# Patient Record
Sex: Male | Born: 1945
Health system: Southern US, Community
[De-identification: ages and names within clinical notes are randomized; demographics above are authoritative.]

## PROBLEM LIST (undated history)

## (undated) DIAGNOSIS — K635 Polyp of colon: Secondary | ICD-10-CM

## (undated) DIAGNOSIS — C4490 Unspecified malignant neoplasm of skin, unspecified: Secondary | ICD-10-CM

## (undated) DIAGNOSIS — J189 Pneumonia, unspecified organism: Secondary | ICD-10-CM

## (undated) DIAGNOSIS — I10 Essential (primary) hypertension: Secondary | ICD-10-CM

## (undated) DIAGNOSIS — T8859XA Other complications of anesthesia, initial encounter: Secondary | ICD-10-CM

## (undated) DIAGNOSIS — N400 Enlarged prostate without lower urinary tract symptoms: Secondary | ICD-10-CM

## (undated) DIAGNOSIS — K5790 Diverticulosis of intestine, part unspecified, without perforation or abscess without bleeding: Secondary | ICD-10-CM

## (undated) DIAGNOSIS — N390 Urinary tract infection, site not specified: Secondary | ICD-10-CM

## (undated) DIAGNOSIS — E785 Hyperlipidemia, unspecified: Secondary | ICD-10-CM

## (undated) HISTORY — DX: Hyperlipidemia, unspecified: E78.5

## (undated) HISTORY — PX: TONSILLECTOMY: SUR1361

## (undated) HISTORY — DX: Polyp of colon: K63.5

## (undated) HISTORY — DX: Diverticulosis of intestine, part unspecified, without perforation or abscess without bleeding: K57.90

## (undated) HISTORY — PX: APPENDECTOMY: SHX54

## (undated) HISTORY — PX: SKIN CANCER EXCISION: SHX779

## (undated) HISTORY — DX: Unspecified malignant neoplasm of skin, unspecified: C44.90

## (undated) HISTORY — DX: Pneumonia, unspecified organism: J18.9

## (undated) HISTORY — DX: Essential (primary) hypertension: I10

## (undated) HISTORY — DX: Urinary tract infection, site not specified: N39.0

## (undated) HISTORY — DX: Benign prostatic hyperplasia without lower urinary tract symptoms: N40.0

## (undated) HISTORY — PX: TRIGGER FINGER RELEASE: SHX641

---

## 2002-06-13 ENCOUNTER — Ambulatory Visit (HOSPITAL_COMMUNITY): Admission: RE | Admit: 2002-06-13 | Discharge: 2002-06-13 | Payer: Self-pay | Admitting: Gastroenterology

## 2003-08-22 DIAGNOSIS — I82409 Acute embolism and thrombosis of unspecified deep veins of unspecified lower extremity: Secondary | ICD-10-CM

## 2003-08-22 HISTORY — DX: Acute embolism and thrombosis of unspecified deep veins of unspecified lower extremity: I82.409

## 2003-09-05 ENCOUNTER — Ambulatory Visit (HOSPITAL_COMMUNITY): Admission: RE | Admit: 2003-09-05 | Discharge: 2003-09-05 | Payer: Self-pay | Admitting: Family Medicine

## 2011-11-07 DIAGNOSIS — H2510 Age-related nuclear cataract, unspecified eye: Secondary | ICD-10-CM | POA: Diagnosis not present

## 2011-11-07 DIAGNOSIS — H5210 Myopia, unspecified eye: Secondary | ICD-10-CM | POA: Diagnosis not present

## 2012-02-28 DIAGNOSIS — Z09 Encounter for follow-up examination after completed treatment for conditions other than malignant neoplasm: Secondary | ICD-10-CM | POA: Diagnosis not present

## 2012-02-28 DIAGNOSIS — K5730 Diverticulosis of large intestine without perforation or abscess without bleeding: Secondary | ICD-10-CM | POA: Diagnosis not present

## 2012-02-28 DIAGNOSIS — Z86010 Personal history of colonic polyps: Secondary | ICD-10-CM | POA: Diagnosis not present

## 2012-11-04 DIAGNOSIS — J40 Bronchitis, not specified as acute or chronic: Secondary | ICD-10-CM | POA: Diagnosis not present

## 2014-12-07 DIAGNOSIS — I8002 Phlebitis and thrombophlebitis of superficial vessels of left lower extremity: Secondary | ICD-10-CM | POA: Diagnosis not present

## 2015-04-14 DIAGNOSIS — J3089 Other allergic rhinitis: Secondary | ICD-10-CM | POA: Diagnosis not present

## 2015-04-14 DIAGNOSIS — I10 Essential (primary) hypertension: Secondary | ICD-10-CM | POA: Diagnosis not present

## 2015-04-14 DIAGNOSIS — E780 Pure hypercholesterolemia: Secondary | ICD-10-CM | POA: Diagnosis not present

## 2015-04-14 DIAGNOSIS — I8000 Phlebitis and thrombophlebitis of superficial vessels of unspecified lower extremity: Secondary | ICD-10-CM | POA: Diagnosis not present

## 2015-10-11 DIAGNOSIS — Z125 Encounter for screening for malignant neoplasm of prostate: Secondary | ICD-10-CM | POA: Diagnosis not present

## 2015-10-11 DIAGNOSIS — E7800 Pure hypercholesterolemia, unspecified: Secondary | ICD-10-CM | POA: Diagnosis not present

## 2015-10-11 DIAGNOSIS — J3089 Other allergic rhinitis: Secondary | ICD-10-CM | POA: Diagnosis not present

## 2015-10-11 DIAGNOSIS — Z0001 Encounter for general adult medical examination with abnormal findings: Secondary | ICD-10-CM | POA: Diagnosis not present

## 2015-11-12 DIAGNOSIS — R899 Unspecified abnormal finding in specimens from other organs, systems and tissues: Secondary | ICD-10-CM | POA: Diagnosis not present

## 2016-08-31 DIAGNOSIS — R21 Rash and other nonspecific skin eruption: Secondary | ICD-10-CM | POA: Diagnosis not present

## 2016-08-31 DIAGNOSIS — M170 Bilateral primary osteoarthritis of knee: Secondary | ICD-10-CM | POA: Diagnosis not present

## 2018-02-15 DIAGNOSIS — H2513 Age-related nuclear cataract, bilateral: Secondary | ICD-10-CM | POA: Diagnosis not present

## 2018-02-15 DIAGNOSIS — H5213 Myopia, bilateral: Secondary | ICD-10-CM | POA: Diagnosis not present

## 2018-10-17 DIAGNOSIS — J45909 Unspecified asthma, uncomplicated: Secondary | ICD-10-CM | POA: Diagnosis not present

## 2018-10-17 DIAGNOSIS — N451 Epididymitis: Secondary | ICD-10-CM | POA: Diagnosis not present

## 2018-10-17 DIAGNOSIS — J309 Allergic rhinitis, unspecified: Secondary | ICD-10-CM | POA: Diagnosis not present

## 2018-10-17 DIAGNOSIS — R361 Hematospermia: Secondary | ICD-10-CM | POA: Diagnosis not present

## 2019-02-18 DIAGNOSIS — H2513 Age-related nuclear cataract, bilateral: Secondary | ICD-10-CM | POA: Diagnosis not present

## 2019-02-18 DIAGNOSIS — H5213 Myopia, bilateral: Secondary | ICD-10-CM | POA: Diagnosis not present

## 2019-02-24 DIAGNOSIS — L989 Disorder of the skin and subcutaneous tissue, unspecified: Secondary | ICD-10-CM | POA: Diagnosis not present

## 2019-02-24 DIAGNOSIS — J309 Allergic rhinitis, unspecified: Secondary | ICD-10-CM | POA: Diagnosis not present

## 2019-02-24 DIAGNOSIS — M170 Bilateral primary osteoarthritis of knee: Secondary | ICD-10-CM | POA: Diagnosis not present

## 2019-02-24 DIAGNOSIS — Z0001 Encounter for general adult medical examination with abnormal findings: Secondary | ICD-10-CM | POA: Diagnosis not present

## 2019-02-24 DIAGNOSIS — I10 Essential (primary) hypertension: Secondary | ICD-10-CM | POA: Diagnosis not present

## 2019-02-24 DIAGNOSIS — D126 Benign neoplasm of colon, unspecified: Secondary | ICD-10-CM | POA: Diagnosis not present

## 2019-02-24 DIAGNOSIS — E7800 Pure hypercholesterolemia, unspecified: Secondary | ICD-10-CM | POA: Diagnosis not present

## 2019-03-03 DIAGNOSIS — D225 Melanocytic nevi of trunk: Secondary | ICD-10-CM | POA: Diagnosis not present

## 2019-03-03 DIAGNOSIS — C44519 Basal cell carcinoma of skin of other part of trunk: Secondary | ICD-10-CM | POA: Diagnosis not present

## 2019-03-03 DIAGNOSIS — Z85828 Personal history of other malignant neoplasm of skin: Secondary | ICD-10-CM | POA: Diagnosis not present

## 2019-03-03 DIAGNOSIS — D485 Neoplasm of uncertain behavior of skin: Secondary | ICD-10-CM | POA: Diagnosis not present

## 2019-03-26 DIAGNOSIS — Z85828 Personal history of other malignant neoplasm of skin: Secondary | ICD-10-CM | POA: Diagnosis not present

## 2019-03-26 DIAGNOSIS — C44519 Basal cell carcinoma of skin of other part of trunk: Secondary | ICD-10-CM | POA: Diagnosis not present

## 2019-04-14 DIAGNOSIS — Z85828 Personal history of other malignant neoplasm of skin: Secondary | ICD-10-CM | POA: Diagnosis not present

## 2019-04-14 DIAGNOSIS — L988 Other specified disorders of the skin and subcutaneous tissue: Secondary | ICD-10-CM | POA: Diagnosis not present

## 2019-04-14 DIAGNOSIS — D485 Neoplasm of uncertain behavior of skin: Secondary | ICD-10-CM | POA: Diagnosis not present

## 2019-09-19 ENCOUNTER — Other Ambulatory Visit: Payer: Self-pay | Admitting: Internal Medicine

## 2019-09-19 ENCOUNTER — Ambulatory Visit
Admission: RE | Admit: 2019-09-19 | Discharge: 2019-09-19 | Disposition: A | Discharge status: Home / Self Care | Payer: Medicare Other | Source: Ambulatory Visit | Attending: Internal Medicine | Admitting: Internal Medicine

## 2019-09-19 DIAGNOSIS — R05 Cough: Secondary | ICD-10-CM | POA: Diagnosis not present

## 2019-09-19 DIAGNOSIS — R053 Chronic cough: Secondary | ICD-10-CM

## 2019-10-30 ENCOUNTER — Ambulatory Visit: Payer: Medicare Other | Attending: Internal Medicine

## 2019-10-30 DIAGNOSIS — Z23 Encounter for immunization: Secondary | ICD-10-CM

## 2019-10-30 NOTE — Progress Notes (Signed)
   Covid-19 Vaccination Clinic  Name:  Frank Novak    MRN: [id number] DOB: 1946/06/16  10/30/2019  [name] was observed post Covid-19 immunization for 15 minutes without incident. He was provided with Vaccine Information Sheet and instruction to access the V-Safe system.   [name] was instructed to call 911 with any severe reactions post vaccine: Marland Kitchen Difficulty breathing  . Swelling of face and throat  . A fast heartbeat  . A bad rash all over body  . Dizziness and weakness   Immunizations Administered    Name Date Dose VIS Date Route   Pfizer COVID-19 Vaccine 10/30/2019 10:24 AM 0.3 mL 08/01/2019 Intramuscular   Manufacturer: [place]   Lot: [id number]   [place]: [id number]

## 2019-11-25 ENCOUNTER — Ambulatory Visit: Payer: Medicare Other | Attending: Internal Medicine

## 2019-11-25 DIAGNOSIS — Z23 Encounter for immunization: Secondary | ICD-10-CM

## 2019-11-25 NOTE — Progress Notes (Signed)
   Covid-19 Vaccination Clinic  Name:  Frank Novak    MRN: [id number] DOB: 08/19/1946  11/25/2019  [name] was observed post Covid-19 immunization for 15 minutes without incident. He was provided with Vaccine Information Sheet and instruction to access the V-Safe system.   [name] was instructed to call 911 with any severe reactions post vaccine: Marland Kitchen Difficulty breathing  . Swelling of face and throat  . A fast heartbeat  . A bad rash all over body  . Dizziness and weakness   Immunizations Administered    Name Date Dose VIS Date Route   Pfizer COVID-19 Vaccine 11/25/2019  9:01 AM 0.3 mL 08/01/2019 Intramuscular   Manufacturer: [place]   Lot: [id number]   [place]: [id number]

## 2020-03-02 DIAGNOSIS — Z85828 Personal history of other malignant neoplasm of skin: Secondary | ICD-10-CM | POA: Diagnosis not present

## 2020-03-02 DIAGNOSIS — D2271 Melanocytic nevi of right lower limb, including hip: Secondary | ICD-10-CM | POA: Diagnosis not present

## 2020-03-02 DIAGNOSIS — D225 Melanocytic nevi of trunk: Secondary | ICD-10-CM | POA: Diagnosis not present

## 2020-03-02 DIAGNOSIS — D485 Neoplasm of uncertain behavior of skin: Secondary | ICD-10-CM | POA: Diagnosis not present

## 2020-03-02 DIAGNOSIS — D2261 Melanocytic nevi of right upper limb, including shoulder: Secondary | ICD-10-CM | POA: Diagnosis not present

## 2020-03-02 DIAGNOSIS — D2262 Melanocytic nevi of left upper limb, including shoulder: Secondary | ICD-10-CM | POA: Diagnosis not present

## 2020-03-02 DIAGNOSIS — D2272 Melanocytic nevi of left lower limb, including hip: Secondary | ICD-10-CM | POA: Diagnosis not present

## 2020-03-11 DIAGNOSIS — Z79899 Other long term (current) drug therapy: Secondary | ICD-10-CM | POA: Diagnosis not present

## 2020-03-11 DIAGNOSIS — Z0000 Encounter for general adult medical examination without abnormal findings: Secondary | ICD-10-CM | POA: Diagnosis not present

## 2020-03-11 DIAGNOSIS — D126 Benign neoplasm of colon, unspecified: Secondary | ICD-10-CM | POA: Diagnosis not present

## 2020-03-11 DIAGNOSIS — I10 Essential (primary) hypertension: Secondary | ICD-10-CM | POA: Diagnosis not present

## 2020-03-11 DIAGNOSIS — Z1389 Encounter for screening for other disorder: Secondary | ICD-10-CM | POA: Diagnosis not present

## 2020-03-11 DIAGNOSIS — M170 Bilateral primary osteoarthritis of knee: Secondary | ICD-10-CM | POA: Diagnosis not present

## 2020-03-11 DIAGNOSIS — R05 Cough: Secondary | ICD-10-CM | POA: Diagnosis not present

## 2020-03-11 DIAGNOSIS — Z1211 Encounter for screening for malignant neoplasm of colon: Secondary | ICD-10-CM | POA: Diagnosis not present

## 2020-03-11 DIAGNOSIS — E7800 Pure hypercholesterolemia, unspecified: Secondary | ICD-10-CM | POA: Diagnosis not present

## 2020-03-11 DIAGNOSIS — J309 Allergic rhinitis, unspecified: Secondary | ICD-10-CM | POA: Diagnosis not present

## 2020-03-16 DIAGNOSIS — Z1211 Encounter for screening for malignant neoplasm of colon: Secondary | ICD-10-CM | POA: Diagnosis not present

## 2020-03-22 DIAGNOSIS — D485 Neoplasm of uncertain behavior of skin: Secondary | ICD-10-CM | POA: Diagnosis not present

## 2020-03-22 DIAGNOSIS — L988 Other specified disorders of the skin and subcutaneous tissue: Secondary | ICD-10-CM | POA: Diagnosis not present

## 2020-07-06 ENCOUNTER — Ambulatory Visit: Payer: Medicare Other | Attending: Internal Medicine

## 2020-07-06 ENCOUNTER — Other Ambulatory Visit: Payer: Self-pay | Admitting: Internal Medicine

## 2020-07-06 DIAGNOSIS — Z23 Encounter for immunization: Secondary | ICD-10-CM

## 2020-07-06 NOTE — Progress Notes (Signed)
   Covid-19 Vaccination Clinic  Name:  Frank Novak    MRN: [id number] DOB: 08-29-1945  07/06/2020  Mr. Soy was observed post Covid-19 immunization for 15 minutes without incident. He was provided with Vaccine Information Sheet and instruction to access the V-Safe system.   [name] was instructed to call 911 with any severe reactions post vaccine: Marland Kitchen Difficulty breathing  . Swelling of face and throat  . A fast heartbeat  . A bad rash all over body  . Dizziness and weakness   Immunizations Administered    Name Date Dose VIS Date Route   Pfizer COVID-19 Vaccine 07/06/2020 10:08 AM 0.3 mL 06/09/2020 Intramuscular   Manufacturer: [place]   Lot: [id number]   [place]: [id number]

## 2020-07-30 DIAGNOSIS — H2513 Age-related nuclear cataract, bilateral: Secondary | ICD-10-CM | POA: Diagnosis not present

## 2020-11-30 DIAGNOSIS — K625 Hemorrhage of anus and rectum: Secondary | ICD-10-CM | POA: Diagnosis not present

## 2020-11-30 DIAGNOSIS — M5430 Sciatica, unspecified side: Secondary | ICD-10-CM | POA: Diagnosis not present

## 2021-02-22 DIAGNOSIS — D2262 Melanocytic nevi of left upper limb, including shoulder: Secondary | ICD-10-CM | POA: Diagnosis not present

## 2021-02-22 DIAGNOSIS — L738 Other specified follicular disorders: Secondary | ICD-10-CM | POA: Diagnosis not present

## 2021-02-22 DIAGNOSIS — Z85828 Personal history of other malignant neoplasm of skin: Secondary | ICD-10-CM | POA: Diagnosis not present

## 2021-02-22 DIAGNOSIS — D2371 Other benign neoplasm of skin of right lower limb, including hip: Secondary | ICD-10-CM | POA: Diagnosis not present

## 2021-02-22 DIAGNOSIS — D2261 Melanocytic nevi of right upper limb, including shoulder: Secondary | ICD-10-CM | POA: Diagnosis not present

## 2021-02-22 DIAGNOSIS — L821 Other seborrheic keratosis: Secondary | ICD-10-CM | POA: Diagnosis not present

## 2021-02-22 DIAGNOSIS — D225 Melanocytic nevi of trunk: Secondary | ICD-10-CM | POA: Diagnosis not present

## 2021-03-30 DIAGNOSIS — Z79899 Other long term (current) drug therapy: Secondary | ICD-10-CM | POA: Diagnosis not present

## 2021-03-30 DIAGNOSIS — E7800 Pure hypercholesterolemia, unspecified: Secondary | ICD-10-CM | POA: Diagnosis not present

## 2021-03-30 DIAGNOSIS — I499 Cardiac arrhythmia, unspecified: Secondary | ICD-10-CM | POA: Diagnosis not present

## 2021-03-30 DIAGNOSIS — I10 Essential (primary) hypertension: Secondary | ICD-10-CM | POA: Diagnosis not present

## 2021-03-30 DIAGNOSIS — Z7189 Other specified counseling: Secondary | ICD-10-CM | POA: Diagnosis not present

## 2021-03-30 DIAGNOSIS — M170 Bilateral primary osteoarthritis of knee: Secondary | ICD-10-CM | POA: Diagnosis not present

## 2021-03-30 DIAGNOSIS — D126 Benign neoplasm of colon, unspecified: Secondary | ICD-10-CM | POA: Diagnosis not present

## 2021-03-30 DIAGNOSIS — J309 Allergic rhinitis, unspecified: Secondary | ICD-10-CM | POA: Diagnosis not present

## 2021-03-30 DIAGNOSIS — Z0000 Encounter for general adult medical examination without abnormal findings: Secondary | ICD-10-CM | POA: Diagnosis not present

## 2021-03-30 DIAGNOSIS — M5430 Sciatica, unspecified side: Secondary | ICD-10-CM | POA: Diagnosis not present

## 2021-03-30 DIAGNOSIS — Z1211 Encounter for screening for malignant neoplasm of colon: Secondary | ICD-10-CM | POA: Diagnosis not present

## 2021-03-30 DIAGNOSIS — Z1389 Encounter for screening for other disorder: Secondary | ICD-10-CM | POA: Diagnosis not present

## 2021-04-08 DIAGNOSIS — Z79899 Other long term (current) drug therapy: Secondary | ICD-10-CM | POA: Diagnosis not present

## 2021-04-08 DIAGNOSIS — I10 Essential (primary) hypertension: Secondary | ICD-10-CM | POA: Diagnosis not present

## 2021-04-08 DIAGNOSIS — E7800 Pure hypercholesterolemia, unspecified: Secondary | ICD-10-CM | POA: Diagnosis not present

## 2021-05-05 DIAGNOSIS — R82998 Other abnormal findings in urine: Secondary | ICD-10-CM | POA: Diagnosis not present

## 2021-05-06 DIAGNOSIS — D539 Nutritional anemia, unspecified: Secondary | ICD-10-CM | POA: Diagnosis not present

## 2021-05-11 DIAGNOSIS — R3129 Other microscopic hematuria: Secondary | ICD-10-CM | POA: Diagnosis not present

## 2021-05-11 DIAGNOSIS — D649 Anemia, unspecified: Secondary | ICD-10-CM | POA: Diagnosis not present

## 2021-05-11 DIAGNOSIS — I809 Phlebitis and thrombophlebitis of unspecified site: Secondary | ICD-10-CM | POA: Diagnosis not present

## 2021-05-11 DIAGNOSIS — R319 Hematuria, unspecified: Secondary | ICD-10-CM | POA: Diagnosis not present

## 2021-05-12 DIAGNOSIS — I83893 Varicose veins of bilateral lower extremities with other complications: Secondary | ICD-10-CM | POA: Diagnosis not present

## 2021-05-12 DIAGNOSIS — I10 Essential (primary) hypertension: Secondary | ICD-10-CM | POA: Diagnosis not present

## 2021-05-12 DIAGNOSIS — I809 Phlebitis and thrombophlebitis of unspecified site: Secondary | ICD-10-CM | POA: Diagnosis not present

## 2021-05-13 ENCOUNTER — Ambulatory Visit: Payer: Medicare Other

## 2021-05-13 DIAGNOSIS — I809 Phlebitis and thrombophlebitis of unspecified site: Secondary | ICD-10-CM | POA: Diagnosis not present

## 2021-05-13 DIAGNOSIS — I8002 Phlebitis and thrombophlebitis of superficial vessels of left lower extremity: Secondary | ICD-10-CM | POA: Diagnosis not present

## 2021-05-13 DIAGNOSIS — M79605 Pain in left leg: Secondary | ICD-10-CM | POA: Diagnosis not present

## 2021-05-19 DIAGNOSIS — I10 Essential (primary) hypertension: Secondary | ICD-10-CM | POA: Diagnosis not present

## 2021-05-19 DIAGNOSIS — I8000 Phlebitis and thrombophlebitis of superficial vessels of unspecified lower extremity: Secondary | ICD-10-CM | POA: Diagnosis not present

## 2021-05-23 IMAGING — CR DG CHEST 2V
2 series · 2 of 2 positions shown · non-contrast
Comparison: None.

CLINICAL DATA: Chronic cough

EXAM:
CHEST - 2 VIEW

[w chest pa]
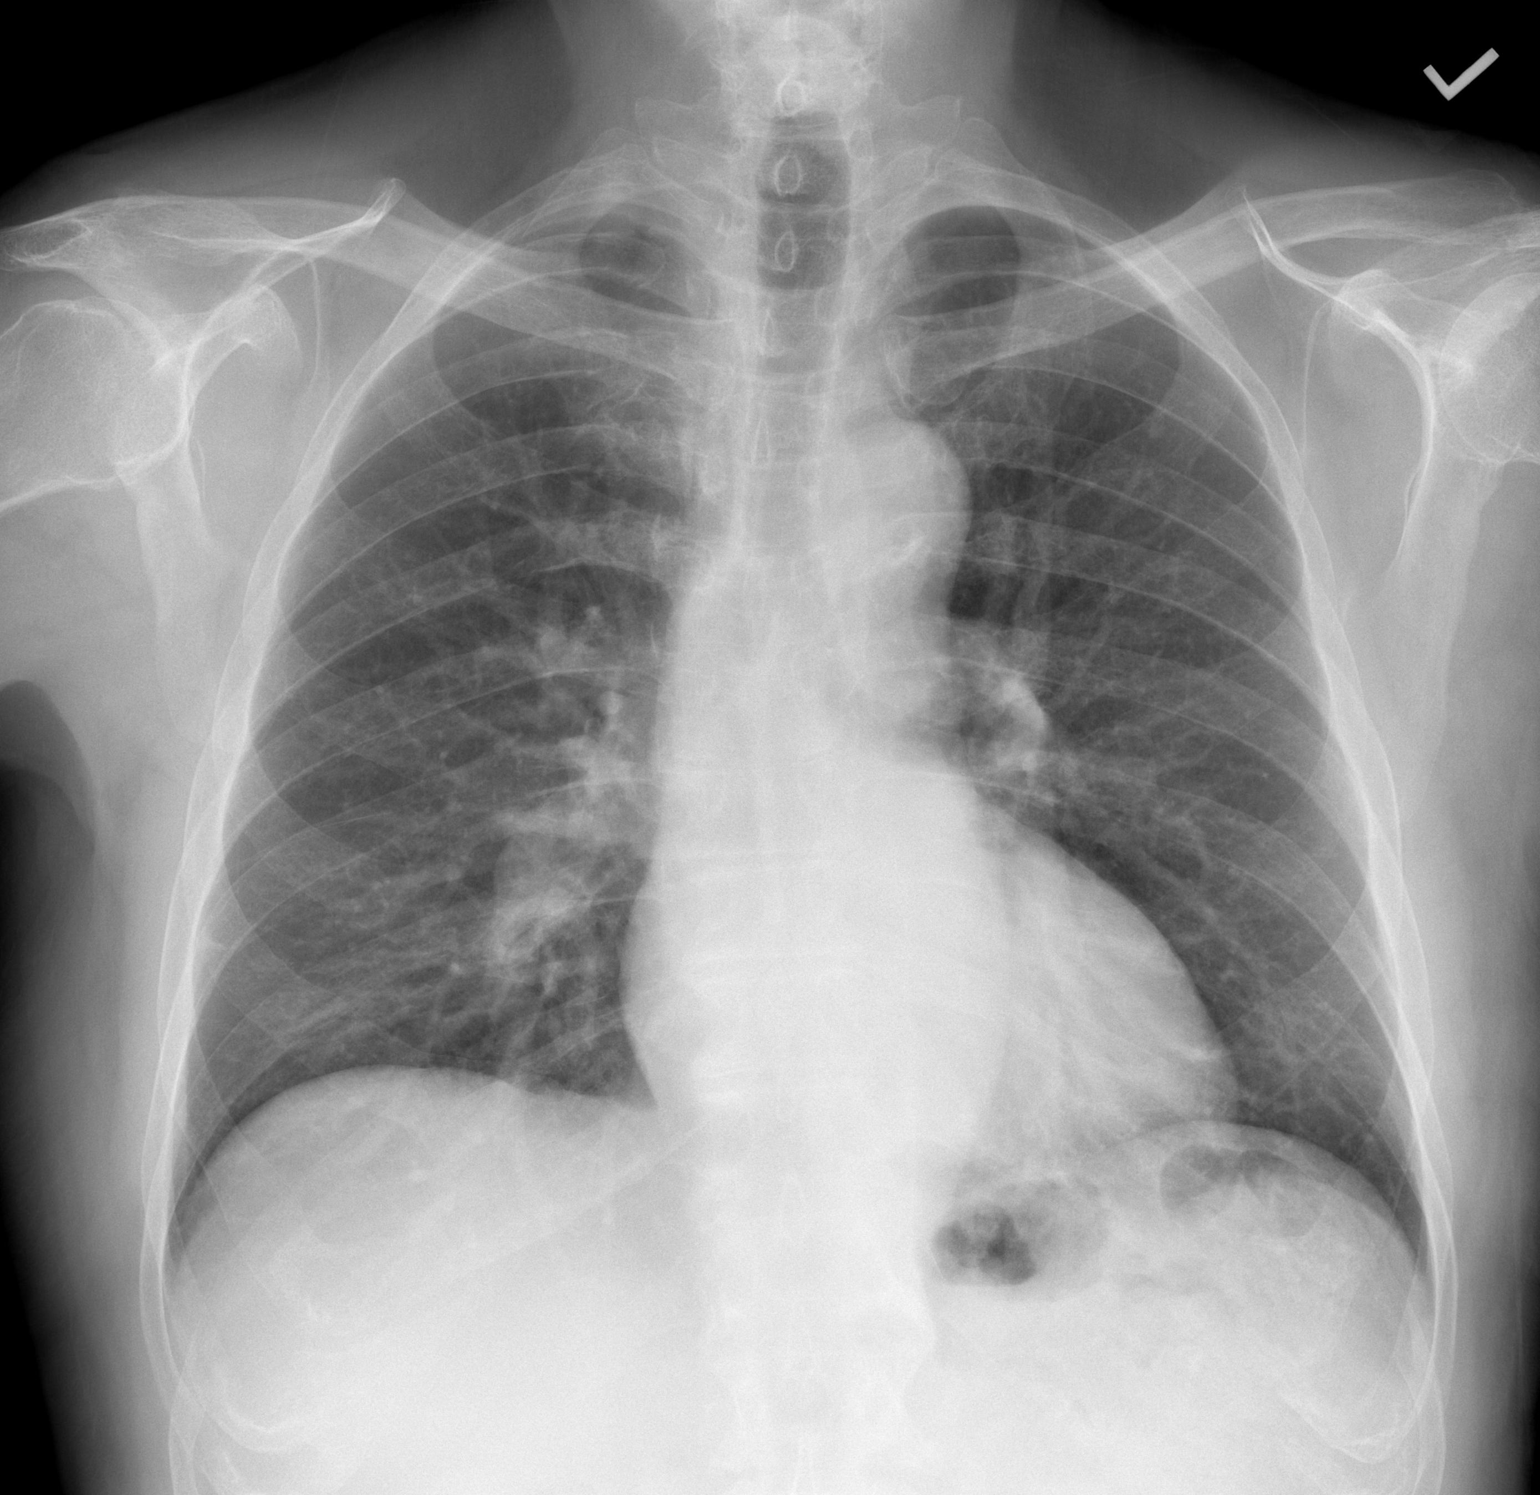

[w chest lat]
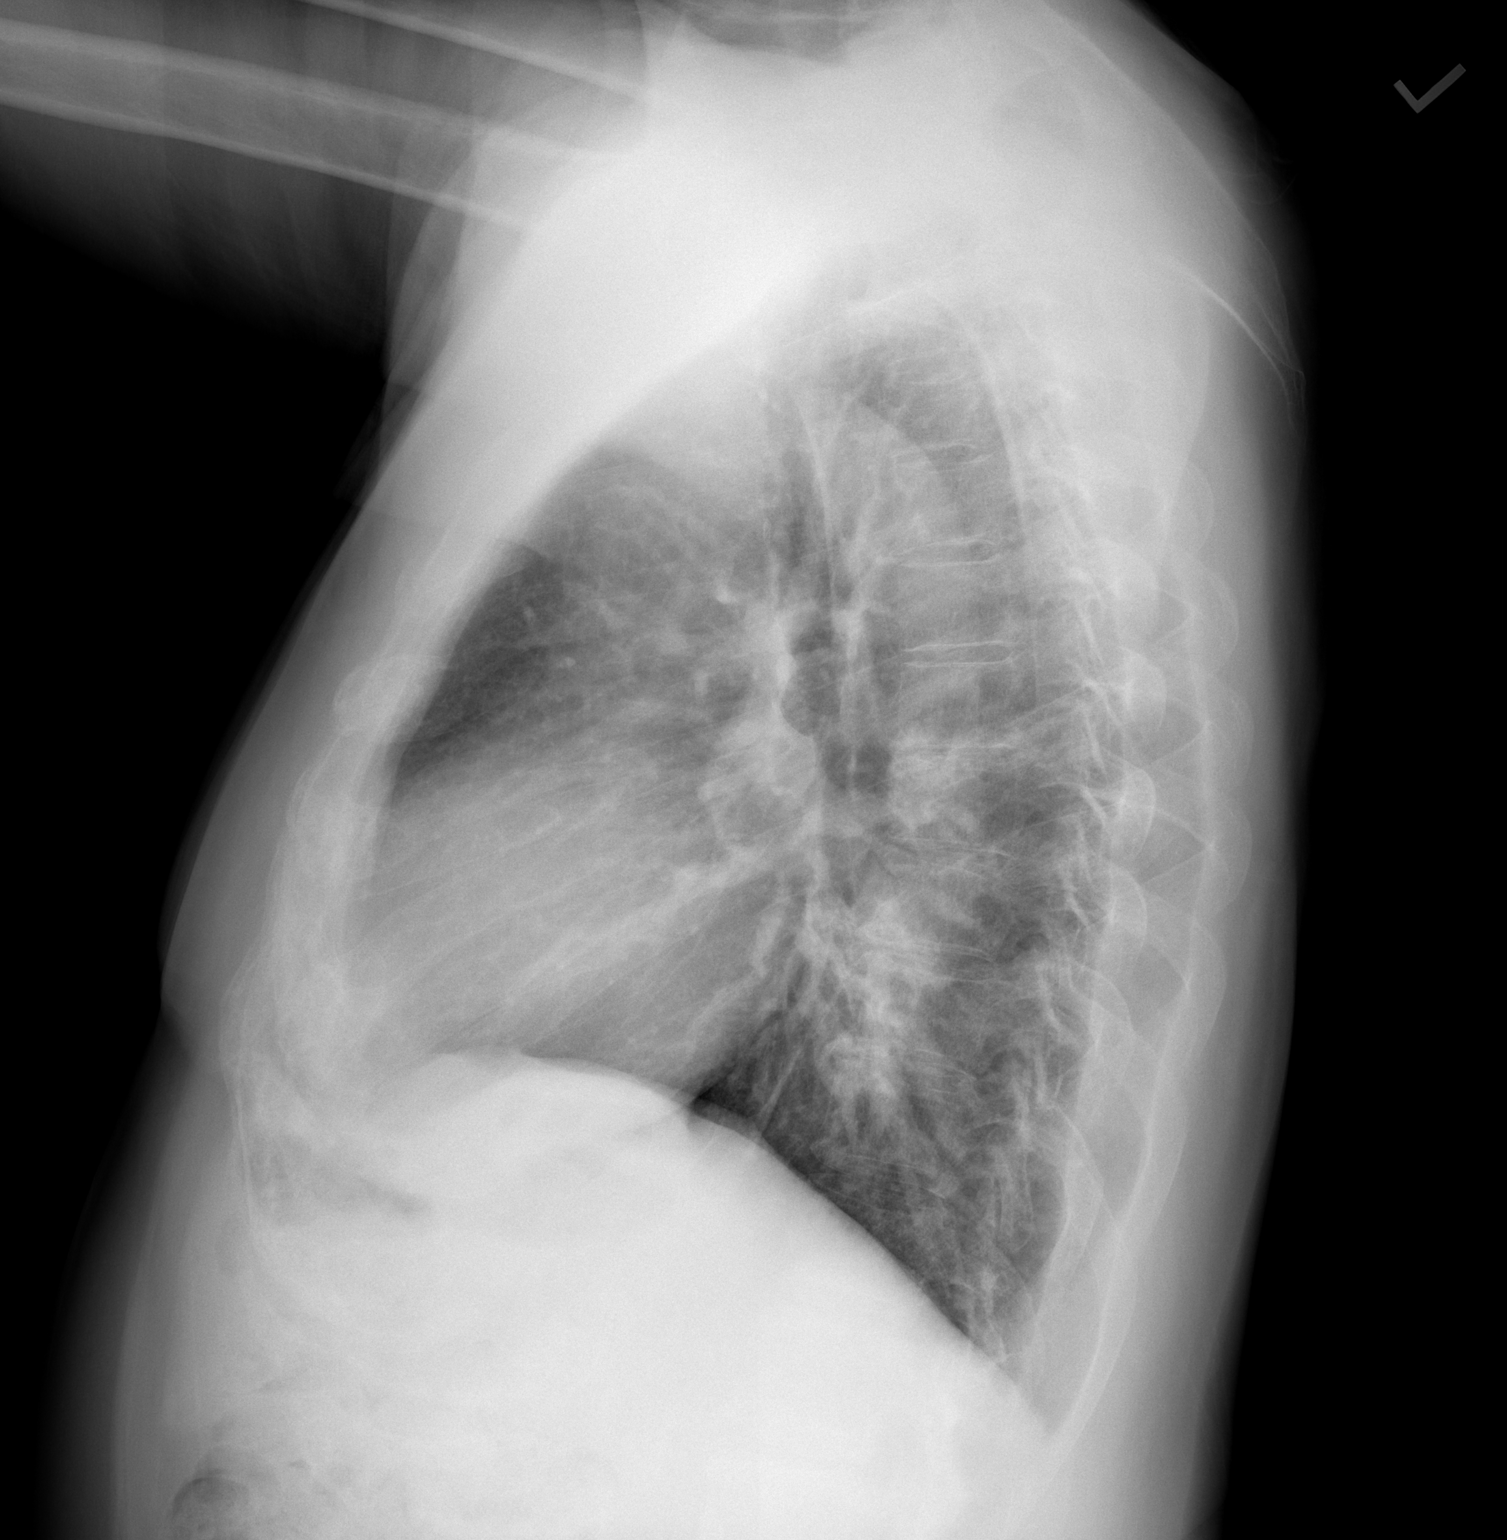

[2 of 2 positions shown; findings below may reference images not displayed]

FINDINGS: Lungs are clear. Heart size and pulmonary vascularity are normal. No
adenopathy. There is aortic atherosclerosis. No bone lesions.
IMPRESSION: Lungs clear. Cardiac silhouette normal. Aortic Atherosclerosis
([id number]).

## 2021-06-09 ENCOUNTER — Encounter: Payer: Self-pay | Admitting: *Deleted

## 2021-06-09 ENCOUNTER — Other Ambulatory Visit: Payer: Self-pay

## 2021-06-09 ENCOUNTER — Ambulatory Visit: Payer: Medicare Other | Attending: Internal Medicine

## 2021-06-09 DIAGNOSIS — Z23 Encounter for immunization: Secondary | ICD-10-CM

## 2021-06-09 MED ORDER — PFIZER COVID-19 VAC BIVALENT 30 MCG/0.3ML IM SUSP
INTRAMUSCULAR | 0 refills | Status: DC
  Filled 2021-06-09: qty 0.3, 1d supply, fill #0

## 2021-06-09 NOTE — Progress Notes (Signed)
   Covid-19 Vaccination Clinic  Name:  TALBERT TREMBATH    MRN: [id number] DOB: August 08, 1946  06/09/2021  Mr. Medlock was observed post Covid-19 immunization for 15 minutes without incident. He was provided with Vaccine Information Sheet and instruction to access the V-Safe system.   Mr. Silbernagel was instructed to call 911 with any severe reactions post vaccine: Difficulty breathing  Swelling of face and throat  A fast heartbeat  A bad rash all over body  Dizziness and weakness   Immunizations Administered     Name Date Dose VIS Date Route   Pfizer Covid-19 Vaccine Bivalent Booster 06/09/2021  1:41 PM 0.3 mL 04/20/2021 Intramuscular   Manufacturer: [place]   Lot: [place]: [id number]       Covid-19 Vaccination Clinic  Name:  KHALON CANSLER    MRN: [id number] DOB: Mar 17, 1946  06/09/2021  Mr. Ryder was observed post Covid-19 immunization for 15 minutes without incident. He was provided with Vaccine Information Sheet and instruction to access the V-Safe system.   Mr. Njoku was instructed to call 911 with any severe reactions post vaccine: Difficulty breathing  Swelling of face and throat  A fast heartbeat  A bad rash all over body  Dizziness and weakness   Immunizations Administered     Name Date Dose VIS Date Route   Pfizer Covid-19 Vaccine Bivalent Booster 06/09/2021  1:41 PM 0.3 mL 04/20/2021 Intramuscular   Manufacturer: [place]   Lot: [id number]   [place]: [phone]

## 2021-06-27 NOTE — Progress Notes (Signed)
[facility] [place]  ASSESSMENT / PLAN: Frank Novak is a 75 y.o. male with chronic venous insufficiency of bilateral lower extremity causing varicosities and thrombophlebitis (C2 disease).  Recommend compression and elevation for symptomatic relief. NSAIDs for thrombophlebitis.  No need for prophylactic anticoagulation in this gentleman.  No role for intervention unless his venous symptoms become e.g swelling becomes unbearable, or he develops venous ulceration. Follow up with me as needed.  CHIEF COMPLAINT: left calf phlebitis  HISTORY OF PRESENT ILLNESS: Frank Novak is a 75 y.o. male referred to clinic for evaluation of left calf thrombophlebitis.  The patient has a history of what sounds like a provoked DVT in the same leg treated 15 years ago with Lovenox injections.  He has been on no anticoagulation since.  He developed a posterior calf thrombophlebitis which has been moderately bothersome to him.  He saw his primary care physician for this who referred him for further evaluation and consideration of intervention for his venous disease.  The patient has no symptoms from venous disease.  He specifically denies bothersome edema, venous ulceration, dyspigmentation.  He is not bothered by his varicosities whatsoever.  He is not interested in long-term anticoagulation if he can avoid it.  He is quite active, and enjoys working around the house and in the yard.  Past Medical History:  Diagnosis Date   BPH (benign prostatic hyperplasia)    DVT (deep venous thrombosis) ([place]) 2005   LLE   Hyperlipidemia     No pertinent past surgical history  No pertinent past family history  Social History   Socioeconomic History   Marital status: Married    Spouse name: Not on file   Number of children: Not on file   Years of education: Not on file   Highest education level: Not on file  Occupational History   Not on file  Tobacco Use   Smoking status: Never   Smokeless  tobacco: Never  Vaping Use   Vaping Use: Never used  Substance and Sexual Activity   Alcohol use: Not Currently   Drug use: Never   Sexual activity: Not on file  Other Topics Concern   Not on file  Social History Narrative   Not on file   Social Determinants of Health   Financial Resource Strain: Not on file  Food Insecurity: Not on file  Transportation Needs: Not on file  Physical Activity: Not on file  Stress: Not on file  Social Connections: Not on file  Intimate Partner Violence: Not on file    Allergies  Allergen Reactions   Penicillins     Child- unsure of reaction    Tolmetin    Propofol Rash   Sulfadiazine Rash   Tetracyclines & Related Rash    No current outpatient medications on file.   No current facility-administered medications for this visit.    REVIEW OF SYSTEMS:  [X]  denotes positive finding, [ ]  denotes negative finding Cardiac  Comments:  Chest pain or chest pressure:    Shortness of breath upon exertion:    Short of breath when lying flat:    Irregular heart rhythm:        Vascular    Pain in calf, thigh, or hip brought on by ambulation:    Pain in feet at night that wakes you up from your sleep:     Blood clot in your veins:    Leg swelling:         Pulmonary  Oxygen at home:    Productive cough:     Wheezing:         Neurologic    Sudden weakness in arms or legs:     Sudden numbness in arms or legs:     Sudden onset of difficulty speaking or slurred speech:    Temporary loss of vision in one eye:     Problems with dizziness:         Gastrointestinal    Blood in stool:     Vomited blood:         Genitourinary    Burning when urinating:     Blood in urine:        Psychiatric    Major depression:         Hematologic    Bleeding problems:    Problems with blood clotting too easily:        Skin    Rashes or ulcers:        Constitutional    Fever or chills:      PHYSICAL EXAM Vitals:   06/28/21 1426  BP: (!)  157/78  Pulse: 70  Resp: 20  Temp: 98.6 F (37 C)  SpO2: 98%  Weight: 197 lb (89.4 kg)  Height: 5' 9.5" (1.765 m)    Constitutional: well appearing. no distress. Appears well nourished.  Neurologic: CN intact. no focal findings. no sensory loss. Psychiatric:  Mood and affect symmetric and appropriate. Eyes:  No icterus. No conjunctival pallor. Ears, nose, throat:  mucous membranes moist. Midline trachea.  Cardiac: regular rate and rhythm.  Respiratory:  unlabored. Abdominal:  soft, non-tender, non-distended.  Peripheral vascular: 2+ DP pulses. Prominent varicosities and reticular veins bilaterally. Extremity: no edema. no cyanosis. no pallor.  Skin: no gangrene. no ulceration.  Lymphatic: no Stemmer's sign. no palpable lymphadenopathy.  PERTINENT LABORATORY AND RADIOLOGIC DATA None  Frank Novak. Stanford Breed, MD [facility] Phone Number: [phone] 06/28/2021 5:23 PM  Total time spent on preparing this encounter including chart review, data review, collecting history, examining the patient, coordinating care for this new patient, 45 minutes.  Portions of this report may have been transcribed using voice recognition software.  Every effort has been made to ensure accuracy; however, inadvertent computerized transcription errors may still be present.

## 2021-06-28 ENCOUNTER — Other Ambulatory Visit: Payer: Self-pay

## 2021-06-28 ENCOUNTER — Encounter: Payer: Self-pay | Admitting: Vascular Surgery

## 2021-06-28 ENCOUNTER — Ambulatory Visit (INDEPENDENT_AMBULATORY_CARE_PROVIDER_SITE_OTHER): Payer: Medicare Other | Admitting: Vascular Surgery

## 2021-06-28 VITALS — BP 157/78 | HR 70 | Temp 98.6°F | Resp 20 | Ht 69.5 in | Wt 197.0 lb

## 2021-06-28 DIAGNOSIS — I8002 Phlebitis and thrombophlebitis of superficial vessels of left lower extremity: Secondary | ICD-10-CM | POA: Diagnosis not present

## 2021-06-28 DIAGNOSIS — I872 Venous insufficiency (chronic) (peripheral): Secondary | ICD-10-CM

## 2021-06-30 DIAGNOSIS — N3943 Post-void dribbling: Secondary | ICD-10-CM | POA: Diagnosis not present

## 2021-06-30 DIAGNOSIS — R030 Elevated blood-pressure reading, without diagnosis of hypertension: Secondary | ICD-10-CM | POA: Diagnosis not present

## 2021-06-30 DIAGNOSIS — R3915 Urgency of urination: Secondary | ICD-10-CM | POA: Diagnosis not present

## 2021-06-30 DIAGNOSIS — R319 Hematuria, unspecified: Secondary | ICD-10-CM | POA: Diagnosis not present

## 2021-06-30 DIAGNOSIS — N401 Enlarged prostate with lower urinary tract symptoms: Secondary | ICD-10-CM | POA: Diagnosis not present

## 2021-06-30 DIAGNOSIS — Z2821 Immunization not carried out because of patient refusal: Secondary | ICD-10-CM | POA: Diagnosis not present

## 2021-06-30 DIAGNOSIS — I809 Phlebitis and thrombophlebitis of unspecified site: Secondary | ICD-10-CM | POA: Diagnosis not present

## 2021-08-01 DIAGNOSIS — H2513 Age-related nuclear cataract, bilateral: Secondary | ICD-10-CM | POA: Diagnosis not present

## 2021-09-20 DIAGNOSIS — H2513 Age-related nuclear cataract, bilateral: Secondary | ICD-10-CM | POA: Diagnosis not present

## 2021-09-20 DIAGNOSIS — H5213 Myopia, bilateral: Secondary | ICD-10-CM | POA: Diagnosis not present

## 2021-11-15 ENCOUNTER — Telehealth: Payer: Self-pay | Admitting: Internal Medicine

## 2021-11-15 NOTE — Telephone Encounter (Signed)
Good morning Dr. Hilarie Fredrickson, ? ?We received records on this patient who is requesting a transfer of care to you.  He is being referred by his PCP, Dr. Inda Merlin.  Patient states he is having no GI issues, is not on blood thinners, and is not diabetic but is due for a colonoscopy.  Per note on referral sheet, patient has an allergy to propofol.  He refuses to go back to his prior GI doc as they had "a disagreement" regarding the propofol.  His records will be sent to you for review.  Please let me know if you approve the transfer. ? ?Thank you. ?

## 2021-11-16 NOTE — Telephone Encounter (Signed)
Records reviewed which should be scanned ? ?Last colonoscopy with Dr. Earle Gell 02/28/2012: Multiple diverticula in the left colon, exam otherwise normal ? ?Pathology results from 2003 adenomatous polyp from the descending colon ? ?Given age I would like to see him in the office first before proceeding with colonoscopy.  We will also need to discuss moderate sedation if he has a propofol allergy. ? ?Okay for office visit with me ?

## 2021-11-18 ENCOUNTER — Encounter: Payer: Self-pay | Admitting: Internal Medicine

## 2021-11-30 ENCOUNTER — Other Ambulatory Visit: Payer: Self-pay

## 2022-01-02 ENCOUNTER — Encounter: Payer: Self-pay | Admitting: *Deleted

## 2022-01-03 ENCOUNTER — Encounter: Payer: Self-pay | Admitting: Internal Medicine

## 2022-01-03 ENCOUNTER — Ambulatory Visit (INDEPENDENT_AMBULATORY_CARE_PROVIDER_SITE_OTHER): Payer: Medicare Other | Admitting: Internal Medicine

## 2022-01-03 VITALS — BP 140/70 | HR 45 | Ht 69.5 in | Wt 197.0 lb

## 2022-01-03 DIAGNOSIS — Z86010 Personal history of colonic polyps: Secondary | ICD-10-CM | POA: Diagnosis not present

## 2022-01-03 MED ORDER — PLENVU 140 G PO SOLR: 1.0000 | ORAL | 0 refills | Status: DC

## 2022-01-03 NOTE — Patient Instructions (Addendum)
You have been scheduled for a colonoscopy. Please follow written instructions given to you at your visit today.  ?Please pick up your prep supplies at the pharmacy within the next 1-3 days. ?If you use inhalers (even only as needed), please bring them with you on the day of your procedure. ? ?If you are age 78 or older, your body mass index should be between 23-30. Your Body mass index is 28.67 kg/m?Marland Kitchen If this is out of the aforementioned range listed, please consider follow up with your Primary Care Provider. ? ?If you are age 47 or younger, your body mass index should be between 19-25. Your Body mass index is 28.67 kg/m?Marland Kitchen If this is out of the aformentioned range listed, please consider follow up with your Primary Care Provider.  ? ?________________________________________________________ ? ?The [facility] providers would like to encourage you to use [facility] to communicate with providers for non-urgent requests or questions.  Due to long hold times on the telephone, sending your provider a message by [facility] may be a faster and more efficient way to get a response.  Please allow 48 business hours for a response.  Please remember that this is for non-urgent requests.  ?_____________________________________________________ ?Due to recent changes in healthcare laws, you may see the results of your imaging and laboratory studies on MyChart before your provider has had a chance to review them.  We understand that in some cases there may be results that are confusing or concerning to you. Not all laboratory results come back in the same time frame and the provider may be waiting for multiple results in order to interpret others.  Please give [place] 48 hours in order for your provider to thoroughly review all the results before contacting the office for clarification of your results.  ? ?

## 2022-01-03 NOTE — Progress Notes (Signed)
Patient ID: Frank Novak, male   DOB: [birth year], 76 y.o.   MRN: [id number] ?HPI: ?Frank Novak is a 76 year old male with a past medical history of adenomatous colon polyps, colonic diverticulosis, prior DVT not requiring chronic anticoagulation, hypertension, hyperlipidemia who is seen to discuss his history of adenomatous colon polyps and possible colonoscopy.  He is here alone today.  He is followed by Dr. Inda Merlin with [facility]. ? ?He reports that he is feeling well.  He has no specific GI complaint.  He reports that about 6 months ago he had a day of red blood per rectum.  He saw Dr. Inda Merlin who did what sounds like anoscopy and he was diagnosed with a fissure.  He has not had recurrent bleeding or perianal/rectal pain.  He reports regular bowel movements.  No constipation or diarrhea.  No melena.  No abdominal pain.  No upper GI or hepatobiliary complaint.  No dysphagia or odynophagia. ? ?No family history of GI tract malignancy. ? ?He has had 3 prior colonoscopies.  The last one that I have record was 02/28/2012 by Dr. Wynetta Emery.  This was normal with a fair prep.  He did have an adenomatous polyp removed in 2003.  He feels that he may have had a colonoscopy about 5 years ago. ? ?He recalls a definitive reaction to propofol where he developed a truncal rash within hours of sedation.  He reports he met with [facility] and requested moderate sedation but they felt like he should use Benadryl prior to propofol.  He was not happy with this recommendation. ? ?Past Medical History:  ?Diagnosis Date  ? BPH (benign prostatic hyperplasia)   ? Colon polyp   ? Diverticulosis   ? DVT (deep venous thrombosis) ([place]) 2005  ? LLE  ? Hyperlipidemia   ? Hypertension   ? Pneumonia   ? Skin cancer   ? Urinary tract infection   ? ? ?History reviewed. No pertinent surgical history. ? ?Outpatient Medications Prior to Visit  ?Medication Sig Dispense Refill  ? tamsulosin (FLOMAX) 0.4 MG CAPS capsule Take 1 capsule by mouth as needed.     ? ?No facility-administered medications prior to visit.  ? ? ?Allergies  ?Allergen Reactions  ? Penicillins   ?  Child- unsure of reaction   ? Tolmetin   ? Propofol Rash  ? Sulfadiazine Rash  ? Tetracyclines & Related Rash  ? ? ?Family History  ?Problem Relation Age of Onset  ? Breast cancer Mother   ? Stomach cancer Neg Hx   ? Colon cancer Neg Hx   ? Esophageal cancer Neg Hx   ? ? ?Social History  ? ?Tobacco Use  ? Smoking status: Never  ? Smokeless tobacco: Never  ?Vaping Use  ? Vaping Use: Never used  ?Substance Use Topics  ? Alcohol use: Not Currently  ? Drug use: Never  ? ? ?ROS: ?As per history of present illness, otherwise negative ? ?BP 140/70   Pulse (!) 45   Ht 5' 9.5" (1.765 m)   Wt 197 lb (89.4 kg)   BMI 28.67 kg/m?  ?Gen: awake, alert, NAD ?HEENT: anicteric  ?CV: RRR, , soft diastolic murmur ?Pulm: CTA b/l ?Abd: soft, NT/ND, +BS throughout ?Ext: no c/c/e ?Neuro: nonfocal ? ? ?ASSESSMENT/PLAN: ?76 year old male with a past medical history of adenomatous colon polyps, colonic diverticulosis, prior DVT not requiring chronic anticoagulation, hypertension, hyperlipidemia who is seen to discuss his history of adenomatous colon polyps and possible colonoscopy. ? ?History of  colon polyps --we discussed screening and surveillance colonoscopy as well as how it relates to age.  Typically this test is recommended until about age 40 but taking into account 1's overall physical wellbeing and other medical conditions.  He is doing quite well and is having no cardiovascular or pulmonary chronic symptoms.  He has had prior colonic polyps.  After our discussion including the risk, benefits and alternatives he is agreeable and wishes to proceed with surveillance colonoscopy.  Given the fact that he had a fair prep previously I recommend 2-day bowel preparation.  We will also use fentanyl and Versed for sedation given his history of propofol allergy. ?--Moderate sedation colonoscopy in the [place] due to propofol  allergy ?--2-day bowel preparation ? ? ? ? ? ?[id number], [place], [place] ?[address] ?[address] ?[place],  [place] [address] ? ? ?

## 2022-02-16 ENCOUNTER — Other Ambulatory Visit: Payer: Medicare Other | Admitting: Internal Medicine

## 2022-02-23 DIAGNOSIS — D2371 Other benign neoplasm of skin of right lower limb, including hip: Secondary | ICD-10-CM | POA: Diagnosis not present

## 2022-02-23 DIAGNOSIS — L738 Other specified follicular disorders: Secondary | ICD-10-CM | POA: Diagnosis not present

## 2022-02-23 DIAGNOSIS — L821 Other seborrheic keratosis: Secondary | ICD-10-CM | POA: Diagnosis not present

## 2022-02-23 DIAGNOSIS — D2261 Melanocytic nevi of right upper limb, including shoulder: Secondary | ICD-10-CM | POA: Diagnosis not present

## 2022-02-23 DIAGNOSIS — Z85828 Personal history of other malignant neoplasm of skin: Secondary | ICD-10-CM | POA: Diagnosis not present

## 2022-02-23 DIAGNOSIS — D2272 Melanocytic nevi of left lower limb, including hip: Secondary | ICD-10-CM | POA: Diagnosis not present

## 2022-02-23 DIAGNOSIS — D2271 Melanocytic nevi of right lower limb, including hip: Secondary | ICD-10-CM | POA: Diagnosis not present

## 2022-02-23 DIAGNOSIS — D225 Melanocytic nevi of trunk: Secondary | ICD-10-CM | POA: Diagnosis not present

## 2022-02-23 DIAGNOSIS — D2262 Melanocytic nevi of left upper limb, including shoulder: Secondary | ICD-10-CM | POA: Diagnosis not present

## 2022-02-23 DIAGNOSIS — D1801 Hemangioma of skin and subcutaneous tissue: Secondary | ICD-10-CM | POA: Diagnosis not present

## 2022-02-23 DIAGNOSIS — D485 Neoplasm of uncertain behavior of skin: Secondary | ICD-10-CM | POA: Diagnosis not present

## 2022-02-23 DIAGNOSIS — L918 Other hypertrophic disorders of the skin: Secondary | ICD-10-CM | POA: Diagnosis not present

## 2022-03-16 ENCOUNTER — Telehealth: Payer: Self-pay | Admitting: Internal Medicine

## 2022-03-16 NOTE — Telephone Encounter (Signed)
[facility] and have given a medicare coupon. Rx is 60 dollars. Patient has been advised of this information and verbalizes understanding.

## 2022-03-16 NOTE — Telephone Encounter (Signed)
Inbound call from patient stating he has not been able to pick his prep up from the pharmacy due to the pharmacy stating there is an issue with insurance. Patient Is scheduled for upcoming procedure 03/20/22. Please give patient a call to advise.  Thank you

## 2022-03-20 ENCOUNTER — Encounter: Payer: Self-pay | Admitting: Internal Medicine

## 2022-03-20 ENCOUNTER — Ambulatory Visit (AMBULATORY_SURGERY_CENTER): Payer: Medicare Other | Admitting: Internal Medicine

## 2022-03-20 VITALS — BP 148/63 | HR 58 | Temp 98.4°F | Resp 15 | Ht 69.0 in | Wt 197.0 lb

## 2022-03-20 DIAGNOSIS — Z86010 Personal history of colonic polyps: Secondary | ICD-10-CM

## 2022-03-20 DIAGNOSIS — Z09 Encounter for follow-up examination after completed treatment for conditions other than malignant neoplasm: Secondary | ICD-10-CM

## 2022-03-20 MED ORDER — SODIUM CHLORIDE 0.9 % IV SOLN: 500.0000 mL | Freq: Once | INTRAVENOUS | Status: DC

## 2022-03-20 NOTE — Progress Notes (Signed)
GASTROENTEROLOGY PROCEDURE H&P NOTE   Primary Care Physician: Josetta Huddle, MD    Reason for Procedure:  History of colon polyps  Plan:    Colonoscopy  Patient is appropriate for endoscopic procedure(s) in the ambulatory ([place]) setting.  The nature of the procedure, as well as the risks, benefits, and alternatives were carefully and thoroughly reviewed with the patient. Ample time for discussion and questions allowed. The patient understood, was satisfied, and agreed to proceed.     HPI: Frank Novak is a 76 y.o. male who presents for surveillance colonoscopy with moderate sedation due to propofol allergy.  Medical history as below.  Tolerated the prep.  No recent chest pain or shortness of breath.  No abdominal pain today.  Past Medical History:  Diagnosis Date   BPH (benign prostatic hyperplasia)    Colon polyp    Diverticulosis    DVT (deep venous thrombosis) ([place]) 2005   LLE   Hyperlipidemia    Hypertension    Pneumonia    Skin cancer    Urinary tract infection     History reviewed. No pertinent surgical history.  Prior to Admission medications   Medication Sig Start Date End Date Taking? Authorizing Provider  tamsulosin (FLOMAX) 0.4 MG CAPS capsule Take 1 capsule by mouth as needed. 06/30/21   [provider]    Current Outpatient Medications  Medication Sig Dispense Refill   tamsulosin (FLOMAX) 0.4 MG CAPS capsule Take 1 capsule by mouth as needed.     Current Facility-Administered Medications  Medication Dose Route Frequency Provider Last Rate Last Admin   0.9 %  sodium chloride infusion  500 mL Intravenous Once Kimmberly Wisser, Lajuan Lines, MD        Allergies as of 03/20/2022 - Review Complete 03/20/2022  Allergen Reaction Noted   Penicillins  06/09/2021   Tolmetin  06/09/2021   Propofol Rash 06/09/2021   Sulfadiazine Rash 06/09/2021   Tetracyclines & related Rash 06/09/2021    Family History  Problem Relation Age of Onset   Breast cancer Mother     Stomach cancer Neg Hx    Colon cancer Neg Hx    Esophageal cancer Neg Hx     Social History   Socioeconomic History   Marital status: Married    Spouse name: Not on file   Number of children: 0   Years of education: Not on file   Highest education level: Not on file  Occupational History   Occupation: retired Customer service manager  Tobacco Use   Smoking status: Never   Smokeless tobacco: Never  Vaping Use   Vaping Use: Never used  Substance and Sexual Activity   Alcohol use: Not Currently   Drug use: Never   Sexual activity: Not on file  Other Topics Concern   Not on file  Social History Narrative   Not on file   Social Determinants of Health   Financial Resource Strain: Not on file  Food Insecurity: Not on file  Transportation Needs: Not on file  Physical Activity: Not on file  Stress: Not on file  Social Connections: Not on file  Intimate Partner Violence: Not on file    Physical Exam: Vital signs in last 24 hours: '@BP'$  (!) 155/68 (BP Location: Right Arm, Patient Position: Sitting, Cuff Size: Normal)   Pulse (!) 58   Temp 98.4 F (36.9 C) (Temporal)   Ht '5\' 9"'$  (1.753 m)   Wt 197 lb (89.4 kg)   SpO2 98%   BMI 29.09 kg/m  GEN: NAD EYE: Sclerae anicteric ENT: MMM CV: Non-tachycardic Pulm: CTA b/l GI: Soft, NT/ND NEURO:  Alert & Oriented x 3   Zenovia Jarred, MD [facility]  03/20/2022 3:06 PM

## 2022-03-20 NOTE — Patient Instructions (Signed)
Handouts on diverticulosis and hemorrhoids given to you today   YOU HAD AN ENDOSCOPIC PROCEDURE TODAY AT [facility]:   Refer to the procedure report that was given to you for any specific questions about what was found during the examination.  If the procedure report does not answer your questions, please call your gastroenterologist to clarify.  If you requested that your care partner not be given the details of your procedure findings, then the procedure report has been included in a sealed envelope for you to review at your convenience later.  YOU SHOULD EXPECT: Some feelings of bloating in the abdomen. Passage of more gas than usual.  Walking can help get rid of the air that was put into your GI tract during the procedure and reduce the bloating. If you had a lower endoscopy (such as a colonoscopy or flexible sigmoidoscopy) you may notice spotting of blood in your stool or on the toilet paper. If you underwent a bowel prep for your procedure, you may not have a normal bowel movement for a few days.  Please Note:  You might notice some irritation and congestion in your nose or some drainage.  This is from the oxygen used during your procedure.  There is no need for concern and it should clear up in a day or so.  SYMPTOMS TO REPORT IMMEDIATELY:  Following lower endoscopy (colonoscopy or flexible sigmoidoscopy):  Excessive amounts of blood in the stool  Significant tenderness or worsening of abdominal pains  Swelling of the abdomen that is new, acute  Fever of 100F or higher  For urgent or emergent issues, a gastroenterologist can be reached at any hour by calling [phone]. Do not use MyChart messaging for urgent concerns.    DIET:  We do recommend a small meal at first, but then you may proceed to your regular diet.  Drink plenty of fluids but you should avoid alcoholic beverages for 24 hours.  ACTIVITY:  You should plan to take it easy for the rest of today and you  should NOT DRIVE or use heavy machinery until tomorrow (because of the sedation medicines used during the test).    FOLLOW UP: Our staff will call the number listed on your records the next business day following your procedure.  We will call around 7:15- 8:00 am to check on you and address any questions or concerns that you may have regarding the information given to you following your procedure. If we do not reach you, we will leave a message.  If you develop any symptoms (ie: fever, flu-like symptoms, shortness of breath, cough etc.) before then, please call [phone].  If you test positive for Covid 19 in the 2 weeks post procedure, please call and report this information to [place].    SIGNATURES/CONFIDENTIALITY: You and/or your care partner have signed paperwork which will be entered into your electronic medical record.  These signatures attest to the fact that that the information above on your After Visit Summary has been reviewed and is understood.  Full responsibility of the confidentiality of this discharge information lies with you and/or your care-partner.

## 2022-03-20 NOTE — Op Note (Signed)
[place] Patient Name: Frank Novak Procedure Date: 03/20/2022 3:07 PM MRN: [id number] Endoscopist: Jerene Bears , MD Age: 76 Referring MD:  Date of Birth: [birth year] Gender: Male Account #: [id number] Procedure:                Colonoscopy Indications:              High risk colon cancer surveillance: Personal                            history of non-advanced adenoma (2003), Last                            colonoscopy 10 years ago (no polyps on last exam) Medicines:                Monitored Anesthesia Care, Fentanyl 100 micrograms                            IV, Midazolam 10 mg IV Procedure:                Pre-Anesthesia Assessment:                           - Prior to the procedure, a History and Physical                            was performed, and patient medications and                            allergies were reviewed. The patient's tolerance of                            previous anesthesia was also reviewed. The risks                            and benefits of the procedure and the sedation                            options and risks were discussed with the patient.                            All questions were answered, and informed consent                            was obtained. Prior Anticoagulants: The patient has                            taken no previous anticoagulant or antiplatelet                            agents. ASA Grade Assessment: II - A patient with                            mild systemic disease. After reviewing the risks  and benefits, the patient was deemed in                            satisfactory condition to undergo the procedure.                           After obtaining informed consent, the colonoscope                            was passed under direct vision. Throughout the                            procedure, the patient's blood pressure, pulse, and                            oxygen saturations were  monitored continuously. The                            CF [id number] #[id number] was introduced through the anus                            and advanced to the cecum, identified by palpation.                            The colonoscopy was performed without difficulty.                            The patient tolerated the procedure well. The                            quality of the bowel preparation was good. The                            ileocecal valve, appendiceal orifice, and rectum                            were photographed. Scope In: 3:20:54 PM Scope Out: 3:36:27 PM Scope Withdrawal Time: 0 hours 9 minutes 23 seconds  Total Procedure Duration: 0 hours 15 minutes 33 seconds  Findings:                 The digital rectal exam was normal.                           Multiple small and large-mouthed diverticula were                            found in the sigmoid colon, descending colon and                            ascending colon.                           Internal hemorrhoids and anal skin tags were found  during retroflexion. The hemorrhoids were small.                           The exam was otherwise without abnormality. Complications:            No immediate complications. Estimated Blood Loss:     Estimated blood loss: none. Impression:               - Severe diverticulosis in the sigmoid colon, in                            the descending colon and mild in the ascending                            colon.                           - Internal hemorrhoids and perianal skin tags.                           - The examination was otherwise normal.                           - No specimens collected. Recommendation:           - Patient has a contact number available for                            emergencies. The signs and symptoms of potential                            delayed complications were discussed with the                            patient. Return to normal  activities tomorrow.                            Written discharge instructions were provided to the                            patient.                           - Resume previous diet.                           - Continue present medications.                           - No repeat colonoscopy due to age at next                            surveillance interval and the absence of colonic                            polyps on today's exam. Jerene Bears, MD 03/20/2022 3:40:32 PM This report has been signed electronically.

## 2022-03-20 NOTE — Progress Notes (Addendum)
Vitals-DT  Pt's states no medical or surgical changes since previsit or office visit.   CRNA is aware of the patient's propofol allergy.

## 2022-03-20 NOTE — Progress Notes (Signed)
PT taken to PACU. Monitors in place. VSS. Report given to RN. 

## 2022-03-21 ENCOUNTER — Telehealth: Payer: Self-pay | Admitting: *Deleted

## 2022-03-21 NOTE — Telephone Encounter (Signed)
  Follow up Call-     03/20/2022    2:19 PM 03/20/2022    2:14 PM  Call back number  Post procedure Call Back phone  # [phone]   Permission to leave phone message  Yes     Patient questions:  Do you have a fever, pain , or abdominal swelling? No. Pain Score  0 *  Have you tolerated food without any problems? Yes.    Have you been able to return to your normal activities? Yes.    Do you have any questions about your discharge instructions: Diet   No. Medications  No. Follow up visit  No.  Do you have questions or concerns about your Care? No.  Actions: * If pain score is 4 or above: No action needed, pain <4.

## 2022-04-03 DIAGNOSIS — D485 Neoplasm of uncertain behavior of skin: Secondary | ICD-10-CM | POA: Diagnosis not present

## 2022-04-03 DIAGNOSIS — L820 Inflamed seborrheic keratosis: Secondary | ICD-10-CM | POA: Diagnosis not present

## 2022-04-05 DIAGNOSIS — Z79899 Other long term (current) drug therapy: Secondary | ICD-10-CM | POA: Diagnosis not present

## 2022-04-05 DIAGNOSIS — E7800 Pure hypercholesterolemia, unspecified: Secondary | ICD-10-CM | POA: Diagnosis not present

## 2022-04-05 DIAGNOSIS — Z1211 Encounter for screening for malignant neoplasm of colon: Secondary | ICD-10-CM | POA: Diagnosis not present

## 2022-04-05 DIAGNOSIS — K4090 Unilateral inguinal hernia, without obstruction or gangrene, not specified as recurrent: Secondary | ICD-10-CM | POA: Diagnosis not present

## 2022-04-05 DIAGNOSIS — M170 Bilateral primary osteoarthritis of knee: Secondary | ICD-10-CM | POA: Diagnosis not present

## 2022-04-05 DIAGNOSIS — I499 Cardiac arrhythmia, unspecified: Secondary | ICD-10-CM | POA: Diagnosis not present

## 2022-04-05 DIAGNOSIS — D126 Benign neoplasm of colon, unspecified: Secondary | ICD-10-CM | POA: Diagnosis not present

## 2022-04-05 DIAGNOSIS — J309 Allergic rhinitis, unspecified: Secondary | ICD-10-CM | POA: Diagnosis not present

## 2022-04-05 DIAGNOSIS — I10 Essential (primary) hypertension: Secondary | ICD-10-CM | POA: Diagnosis not present

## 2022-04-05 DIAGNOSIS — Z1331 Encounter for screening for depression: Secondary | ICD-10-CM | POA: Diagnosis not present

## 2022-04-05 DIAGNOSIS — M5430 Sciatica, unspecified side: Secondary | ICD-10-CM | POA: Diagnosis not present

## 2022-04-05 DIAGNOSIS — Z0000 Encounter for general adult medical examination without abnormal findings: Secondary | ICD-10-CM | POA: Diagnosis not present

## 2022-06-07 DIAGNOSIS — M79604 Pain in right leg: Secondary | ICD-10-CM | POA: Diagnosis not present

## 2022-06-07 DIAGNOSIS — I10 Essential (primary) hypertension: Secondary | ICD-10-CM | POA: Diagnosis not present

## 2022-08-03 DIAGNOSIS — H2513 Age-related nuclear cataract, bilateral: Secondary | ICD-10-CM | POA: Diagnosis not present

## 2022-09-07 DIAGNOSIS — R399 Unspecified symptoms and signs involving the genitourinary system: Secondary | ICD-10-CM | POA: Diagnosis not present

## 2022-09-07 DIAGNOSIS — K921 Melena: Secondary | ICD-10-CM | POA: Diagnosis not present

## 2022-09-07 DIAGNOSIS — R198 Other specified symptoms and signs involving the digestive system and abdomen: Secondary | ICD-10-CM | POA: Diagnosis not present

## 2022-09-08 DIAGNOSIS — R198 Other specified symptoms and signs involving the digestive system and abdomen: Secondary | ICD-10-CM | POA: Diagnosis not present

## 2022-09-26 DIAGNOSIS — H5213 Myopia, bilateral: Secondary | ICD-10-CM | POA: Diagnosis not present

## 2022-09-26 DIAGNOSIS — H2513 Age-related nuclear cataract, bilateral: Secondary | ICD-10-CM | POA: Diagnosis not present

## 2023-01-16 DIAGNOSIS — M25562 Pain in left knee: Secondary | ICD-10-CM | POA: Diagnosis not present

## 2023-01-16 DIAGNOSIS — M25561 Pain in right knee: Secondary | ICD-10-CM | POA: Diagnosis not present

## 2023-01-24 DIAGNOSIS — N400 Enlarged prostate without lower urinary tract symptoms: Secondary | ICD-10-CM | POA: Diagnosis not present

## 2023-01-24 DIAGNOSIS — R3121 Asymptomatic microscopic hematuria: Secondary | ICD-10-CM | POA: Diagnosis not present

## 2023-02-05 DIAGNOSIS — I10 Essential (primary) hypertension: Secondary | ICD-10-CM | POA: Diagnosis not present

## 2023-02-05 DIAGNOSIS — M170 Bilateral primary osteoarthritis of knee: Secondary | ICD-10-CM | POA: Diagnosis not present

## 2023-02-05 DIAGNOSIS — K4090 Unilateral inguinal hernia, without obstruction or gangrene, not specified as recurrent: Secondary | ICD-10-CM | POA: Diagnosis not present

## 2023-02-05 DIAGNOSIS — E7800 Pure hypercholesterolemia, unspecified: Secondary | ICD-10-CM | POA: Diagnosis not present

## 2023-02-05 DIAGNOSIS — M5430 Sciatica, unspecified side: Secondary | ICD-10-CM | POA: Diagnosis not present

## 2023-02-19 DIAGNOSIS — K5730 Diverticulosis of large intestine without perforation or abscess without bleeding: Secondary | ICD-10-CM | POA: Diagnosis not present

## 2023-02-19 DIAGNOSIS — R3121 Asymptomatic microscopic hematuria: Secondary | ICD-10-CM | POA: Diagnosis not present

## 2023-02-19 DIAGNOSIS — N400 Enlarged prostate without lower urinary tract symptoms: Secondary | ICD-10-CM | POA: Diagnosis not present

## 2023-02-19 DIAGNOSIS — N200 Calculus of kidney: Secondary | ICD-10-CM | POA: Diagnosis not present

## 2023-02-19 DIAGNOSIS — K4090 Unilateral inguinal hernia, without obstruction or gangrene, not specified as recurrent: Secondary | ICD-10-CM | POA: Diagnosis not present

## 2023-02-21 DIAGNOSIS — I10 Essential (primary) hypertension: Secondary | ICD-10-CM | POA: Diagnosis not present

## 2023-03-07 DIAGNOSIS — R3121 Asymptomatic microscopic hematuria: Secondary | ICD-10-CM | POA: Diagnosis not present

## 2023-03-07 DIAGNOSIS — N200 Calculus of kidney: Secondary | ICD-10-CM | POA: Diagnosis not present

## 2023-03-07 DIAGNOSIS — N400 Enlarged prostate without lower urinary tract symptoms: Secondary | ICD-10-CM | POA: Diagnosis not present

## 2023-03-16 DIAGNOSIS — L218 Other seborrheic dermatitis: Secondary | ICD-10-CM | POA: Diagnosis not present

## 2023-03-16 DIAGNOSIS — D2272 Melanocytic nevi of left lower limb, including hip: Secondary | ICD-10-CM | POA: Diagnosis not present

## 2023-03-16 DIAGNOSIS — L738 Other specified follicular disorders: Secondary | ICD-10-CM | POA: Diagnosis not present

## 2023-03-16 DIAGNOSIS — D2261 Melanocytic nevi of right upper limb, including shoulder: Secondary | ICD-10-CM | POA: Diagnosis not present

## 2023-03-16 DIAGNOSIS — D225 Melanocytic nevi of trunk: Secondary | ICD-10-CM | POA: Diagnosis not present

## 2023-03-16 DIAGNOSIS — L821 Other seborrheic keratosis: Secondary | ICD-10-CM | POA: Diagnosis not present

## 2023-03-16 DIAGNOSIS — D2271 Melanocytic nevi of right lower limb, including hip: Secondary | ICD-10-CM | POA: Diagnosis not present

## 2023-03-16 DIAGNOSIS — D1801 Hemangioma of skin and subcutaneous tissue: Secondary | ICD-10-CM | POA: Diagnosis not present

## 2023-03-16 DIAGNOSIS — Z85828 Personal history of other malignant neoplasm of skin: Secondary | ICD-10-CM | POA: Diagnosis not present

## 2023-03-16 DIAGNOSIS — D2262 Melanocytic nevi of left upper limb, including shoulder: Secondary | ICD-10-CM | POA: Diagnosis not present

## 2023-04-25 ENCOUNTER — Encounter: Payer: Self-pay | Admitting: Pulmonary Disease

## 2023-04-25 ENCOUNTER — Ambulatory Visit (INDEPENDENT_AMBULATORY_CARE_PROVIDER_SITE_OTHER): Payer: Medicare Other | Admitting: Pulmonary Disease

## 2023-04-25 VITALS — BP 128/70 | HR 50 | Ht 69.5 in | Wt 196.2 lb

## 2023-04-25 DIAGNOSIS — I499 Cardiac arrhythmia, unspecified: Secondary | ICD-10-CM

## 2023-04-25 DIAGNOSIS — R911 Solitary pulmonary nodule: Secondary | ICD-10-CM

## 2023-04-25 DIAGNOSIS — R011 Cardiac murmur, unspecified: Secondary | ICD-10-CM

## 2023-04-25 DIAGNOSIS — Z85820 Personal history of malignant melanoma of skin: Secondary | ICD-10-CM | POA: Diagnosis not present

## 2023-04-25 NOTE — Patient Instructions (Addendum)
Thank you for visiting Dr. Tonia Brooms at [facility]. Today we recommend the following:  Orders Placed This Encounter  Procedures   NM PET Image Initial (PI) Skull Base To Thigh (F-18 FDG)   Return in about 3 weeks (around 05/16/2023) for with APP or Dr. Tonia Brooms, after PET/CT Chest.    Please do your part to reduce the spread of COVID-19.

## 2023-04-25 NOTE — Progress Notes (Signed)
Synopsis: Referred in September 2024 for pulmonary nodule by Crista Elliot, MD  Subjective:   PATIENT ID: Frank Mylar GENDER: male DOB: [birth year], MRN: [id number]  Chief Complaint  Patient presents with   Consult    Consult of lung nodule.    This is a 77 year old male, past medical history of BPH, history of DVT, hyperlipidemia, hypertension.  Was diagnosed with a melanoma on his back and had what appears to be a Mohs surgery.  This is all historical information from the patient.  I do not have records to review.  Patient states that this was completed at [facility] dermatology and the surgery was completed as an outpatient.  Otherwise he does not have any other history of malignancy.  And he does however state that he smoked approximately 4 cigarettes in his entire life.     Past Medical History:  Diagnosis Date   BPH (benign prostatic hyperplasia)    Colon polyp    Diverticulosis    DVT (deep venous thrombosis) (HCC) 2005   LLE   Hyperlipidemia    Hypertension    Pneumonia    Skin cancer    Urinary tract infection      Family History  Problem Relation Age of Onset   Breast cancer Mother    Stomach cancer Neg Hx    Colon cancer Neg Hx    Esophageal cancer Neg Hx      No past surgical history on file.  Social History   Socioeconomic History   Marital status: Married    Spouse name: Not on file   Number of children: 0   Years of education: Not on file   Highest education level: Not on file  Occupational History   Occupation: retired Psychologist, occupational  Tobacco Use   Smoking status: Never   Smokeless tobacco: Never  Vaping Use   Vaping status: Never Used  Substance and Sexual Activity   Alcohol use: Not Currently   Drug use: Never   Sexual activity: Not on file  Other Topics Concern   Not on file  Social History Narrative   Not on file   Social Determinants of Health   Financial Resource Strain: Not on file  Food Insecurity: Not on file   Transportation Needs: Not on file  Physical Activity: Not on file  Stress: Not on file  Social Connections: Not on file  Intimate Partner Violence: Not on file     Allergies  Allergen Reactions   Penicillins     Child- unsure of reaction    Tolmetin    Propofol Rash   Sulfadiazine Rash   Tetracyclines & Related Rash     Outpatient Medications Prior to Visit  Medication Sig Dispense Refill   hydrochlorothiazide (HYDRODIURIL) 12.5 MG tablet Take 12.5 mg by mouth daily.     naproxen (NAPROSYN) 500 MG tablet Take 500 mg by mouth 2 (two) times daily with a meal.     tamsulosin (FLOMAX) 0.4 MG CAPS capsule Take 1 capsule by mouth as needed.     No facility-administered medications prior to visit.    Review of Systems  Constitutional:  Negative for chills, fever, malaise/fatigue and weight loss.  HENT:  Negative for hearing loss, sore throat and tinnitus.   Eyes:  Negative for blurred vision and double vision.  Respiratory:  Negative for cough, hemoptysis, sputum production, shortness of breath, wheezing and stridor.   Cardiovascular:  Negative for chest pain, palpitations, orthopnea, leg swelling and PND.  Gastrointestinal:  Negative for abdominal pain, constipation, diarrhea, heartburn, nausea and vomiting.  Genitourinary:  Negative for dysuria, hematuria and urgency.  Musculoskeletal:  Negative for joint pain and myalgias.  Skin:  Negative for itching and rash.  Neurological:  Negative for dizziness, tingling, weakness and headaches.  Endo/Heme/Allergies:  Negative for environmental allergies. Does not bruise/bleed easily.  Psychiatric/Behavioral:  Negative for depression. The patient is not nervous/anxious and does not have insomnia.   All other systems reviewed and are negative.    Objective:  Physical Exam Vitals reviewed.  Constitutional:      General: He is not in acute distress.    Appearance: He is well-developed.  HENT:     Head: Normocephalic and atraumatic.   Eyes:     General: No scleral icterus.    Conjunctiva/sclera: Conjunctivae normal.     Pupils: Pupils are equal, round, and reactive to light.  Neck:     Vascular: No JVD.     Trachea: No tracheal deviation.  Cardiovascular:     Rate and Rhythm: Normal rate. Rhythm irregular.     Heart sounds: Murmur heard.  Pulmonary:     Effort: Pulmonary effort is normal. No tachypnea, accessory muscle usage or respiratory distress.     Breath sounds: No stridor. No wheezing, rhonchi or rales.  Abdominal:     General: There is no distension.     Palpations: Abdomen is soft.     Tenderness: There is no abdominal tenderness.  Musculoskeletal:        General: No tenderness.     Cervical back: Neck supple.  Lymphadenopathy:     Cervical: No cervical adenopathy.  Skin:    General: Skin is warm and dry.     Capillary Refill: Capillary refill takes less than 2 seconds.     Findings: No rash.  Neurological:     Mental Status: He is alert and oriented to person, place, and time.  Psychiatric:        Behavior: Behavior normal.      Vitals:   04/25/23 1059  BP: 128/70  Pulse: (!) 50  SpO2: 97%  Weight: 196 lb 3.2 oz (89 kg)  Height: 5' 9.5" (1.765 m)   97% on RA BMI Readings from Last 3 Encounters:  04/25/23 28.56 kg/m  03/20/22 29.09 kg/m  01/03/22 28.67 kg/m   Wt Readings from Last 3 Encounters:  04/25/23 196 lb 3.2 oz (89 kg)  03/20/22 197 lb (89.4 kg)  01/03/22 197 lb (89.4 kg)     CBC No results found for: "WBC", "RBC", "HGB", "HCT", "PLT", "MCV", "MCH", "MCHC", "RDW", "LYMPHSABS", "MONOABS", "EOSABS", "BASOSABS"    Chest Imaging:  CT chest abdomen pelvis: February 19, 2023 found to have a right lower lobe 3 cm lung mass concerning for malignancy. The patient's images have been independently reviewed by me.    Pulmonary Functions Testing Results:     No data to display          FeNO:   Pathology:   Patient believes to have had a melanoma 3 years s/p amb  surgery, sounds like MOHS procedure   Echocardiogram:   Heart Catheterization:     Assessment & Plan:     ICD-10-CM   1. Lung nodule  R91.1 NM PET Image Initial (PI) Skull Base To Thigh (F-18 FDG)    2. History of melanoma  Z85.820 NM PET Image Initial (PI) Skull Base To Thigh (F-18 FDG)    3. Murmur  R01.1 ECHOCARDIOGRAM COMPLETE  EKG 12-Lead    4. Irregular heart beat  I49.9       Discussion:  This is a 77 year old gentleman found to have a right lower lobe lung mass measuring greater than 3 cm.  He is a non-smoker smoked approximately 4 cigarettes in his entire life.  Has a history of melanoma from 3 years ago that was removed by [facility] dermatology.  He describes the procedure what sounds to be a Mohs procedure.  He also has a heart murmur that is systolic in nature and irregular heartbeat.  Plan: With his history of malignancy and a lesion greater than 3 cm its highly likely that we are dealing with a cancer within the chest. I think next best step is to obtain full imaging. I have ordered a PET scan for further evaluation. This will also help [place] decide appropriateness of location for biopsy. Patient is agreeable to this plan. RTC in approximately 2 to 3 weeks after PET scan is complete. We will also get a EKG today and I have ordered an echocardiogram for the abnormalities discovered on his cardiac exam.    Current Outpatient Medications:    hydrochlorothiazide (HYDRODIURIL) 12.5 MG tablet, Take 12.5 mg by mouth daily., Disp: , Rfl:    naproxen (NAPROSYN) 500 MG tablet, Take 500 mg by mouth 2 (two) times daily with a meal., Disp: , Rfl:    tamsulosin (FLOMAX) 0.4 MG CAPS capsule, Take 1 capsule by mouth as needed., Disp: , Rfl:    Josephine Igo, DO [facility] 04/25/2023 11:42 AM

## 2023-05-04 ENCOUNTER — Ambulatory Visit: Payer: Medicare Other

## 2023-05-07 DIAGNOSIS — J309 Allergic rhinitis, unspecified: Secondary | ICD-10-CM | POA: Diagnosis not present

## 2023-05-07 DIAGNOSIS — R918 Other nonspecific abnormal finding of lung field: Secondary | ICD-10-CM | POA: Diagnosis not present

## 2023-05-07 DIAGNOSIS — M170 Bilateral primary osteoarthritis of knee: Secondary | ICD-10-CM | POA: Diagnosis not present

## 2023-05-07 DIAGNOSIS — Z0000 Encounter for general adult medical examination without abnormal findings: Secondary | ICD-10-CM | POA: Diagnosis not present

## 2023-05-07 DIAGNOSIS — M5430 Sciatica, unspecified side: Secondary | ICD-10-CM | POA: Diagnosis not present

## 2023-05-07 DIAGNOSIS — R748 Abnormal levels of other serum enzymes: Secondary | ICD-10-CM | POA: Diagnosis not present

## 2023-05-07 DIAGNOSIS — R011 Cardiac murmur, unspecified: Secondary | ICD-10-CM | POA: Diagnosis not present

## 2023-05-07 DIAGNOSIS — K4090 Unilateral inguinal hernia, without obstruction or gangrene, not specified as recurrent: Secondary | ICD-10-CM | POA: Diagnosis not present

## 2023-05-07 DIAGNOSIS — E7800 Pure hypercholesterolemia, unspecified: Secondary | ICD-10-CM | POA: Diagnosis not present

## 2023-05-07 DIAGNOSIS — Z1331 Encounter for screening for depression: Secondary | ICD-10-CM | POA: Diagnosis not present

## 2023-05-07 DIAGNOSIS — Z79899 Other long term (current) drug therapy: Secondary | ICD-10-CM | POA: Diagnosis not present

## 2023-05-07 DIAGNOSIS — I10 Essential (primary) hypertension: Secondary | ICD-10-CM | POA: Diagnosis not present

## 2023-05-11 ENCOUNTER — Ambulatory Visit
Admission: RE | Admit: 2023-05-11 | Discharge: 2023-05-11 | Disposition: A | Discharge status: Home / Self Care | Payer: Medicare Other | Source: Ambulatory Visit | Attending: Pulmonary Disease

## 2023-05-11 DIAGNOSIS — R911 Solitary pulmonary nodule: Secondary | ICD-10-CM | POA: Diagnosis not present

## 2023-05-11 DIAGNOSIS — Z85820 Personal history of malignant melanoma of skin: Secondary | ICD-10-CM | POA: Insufficient documentation

## 2023-05-11 DIAGNOSIS — C439 Malignant melanoma of skin, unspecified: Secondary | ICD-10-CM | POA: Diagnosis not present

## 2023-05-11 LAB — GLUCOSE, CAPILLARY: Glucose-Capillary: 101 mg/dL — ABNORMAL HIGH (ref 70–99)

## 2023-05-11 MED ORDER — FLUDEOXYGLUCOSE F - 18 (FDG) INJECTION
10.2000 | Freq: Once | INTRAVENOUS | Status: AC | PRN
  Administered 2023-05-11: 10.92 via INTRAVENOUS

## 2023-05-18 ENCOUNTER — Ambulatory Visit (INDEPENDENT_AMBULATORY_CARE_PROVIDER_SITE_OTHER): Payer: Medicare Other

## 2023-05-18 DIAGNOSIS — R011 Cardiac murmur, unspecified: Secondary | ICD-10-CM

## 2023-05-18 LAB — ECHOCARDIOGRAM COMPLETE
AR max vel: 1.35 cm2
AV Area VTI: 1.42 cm2
AV Area mean vel: 1.22 cm2
AV Mean grad: 8.3 mm[Hg]
AV Peak grad: 20.5 mm[Hg]
AV Vena cont: 0.25 cm
Ao pk vel: 2.26 m/s
Area-P 1/2: 4.63 cm2
S' Lateral: 3.08 cm

## 2023-05-23 NOTE — Progress Notes (Signed)
[name], he is seeing you in follow up. Please order a dedicated liver US based on the ECHO findings.   Thanks,  BLI  Josephine Igo, DO [facility] 05/23/2023 4:14 PM

## 2023-05-24 NOTE — Progress Notes (Signed)
Thanks [employer]

## 2023-05-25 ENCOUNTER — Ambulatory Visit: Payer: Medicare Other | Admitting: Pulmonary Disease

## 2023-06-08 ENCOUNTER — Ambulatory Visit (INDEPENDENT_AMBULATORY_CARE_PROVIDER_SITE_OTHER): Payer: Medicare Other | Admitting: Primary Care

## 2023-06-08 ENCOUNTER — Encounter: Payer: Self-pay | Admitting: Primary Care

## 2023-06-08 VITALS — BP 130/70 | HR 56 | Temp 97.8°F | Ht 69.5 in | Wt 189.0 lb

## 2023-06-08 DIAGNOSIS — N401 Enlarged prostate with lower urinary tract symptoms: Secondary | ICD-10-CM | POA: Insufficient documentation

## 2023-06-08 DIAGNOSIS — K7689 Other specified diseases of liver: Secondary | ICD-10-CM

## 2023-06-08 DIAGNOSIS — R059 Cough, unspecified: Secondary | ICD-10-CM | POA: Insufficient documentation

## 2023-06-08 DIAGNOSIS — I8000 Phlebitis and thrombophlebitis of superficial vessels of unspecified lower extremity: Secondary | ICD-10-CM | POA: Insufficient documentation

## 2023-06-08 DIAGNOSIS — I10 Essential (primary) hypertension: Secondary | ICD-10-CM | POA: Insufficient documentation

## 2023-06-08 DIAGNOSIS — E7800 Pure hypercholesterolemia, unspecified: Secondary | ICD-10-CM | POA: Insufficient documentation

## 2023-06-08 DIAGNOSIS — R911 Solitary pulmonary nodule: Secondary | ICD-10-CM

## 2023-06-08 DIAGNOSIS — J309 Allergic rhinitis, unspecified: Secondary | ICD-10-CM | POA: Insufficient documentation

## 2023-06-08 DIAGNOSIS — R918 Other nonspecific abnormal finding of lung field: Secondary | ICD-10-CM | POA: Diagnosis not present

## 2023-06-08 DIAGNOSIS — I499 Cardiac arrhythmia, unspecified: Secondary | ICD-10-CM | POA: Insufficient documentation

## 2023-06-08 NOTE — Progress Notes (Signed)
Discussed, he is in agreement with proceeding with bronchoscopy for further work up for lung mass, 10/28 will work. Ordered liver u/s as well.

## 2023-06-08 NOTE — Patient Instructions (Addendum)
PET scan showed 7.1 cm right lower lobe mass, non FDG avid, not suggestive of metastasis related to melanoma. This appearance is unusual, but indolent primary bronchogenic carcinoma remains possible. Consider bronchoscopy for tissue sampling. No findings suspicious for metastatic disease.  Hepatic cyst measuring 2cm, need ultrasound to evaluated   Orders: Liver ultrasound Bronchoscopy with Dr. Tonia Brooms, planning for 06/18/23  Follow-up: 4 week virtual visit with either Dr. Tonia Brooms or Waynetta Sandy NP (Friday afternoon ok)   Flexible Bronchoscopy  Flexible bronchoscopy is a procedure used to examine the passageways in the lungs. During the procedure, a thin, flexible tool with a camera (bronchoscope) is passed into the mouth or nose, down through the windpipe (trachea), and into the air tubes in the lungs (bronchi). This tool allows the health care provider to look inside the lungs and to take samples for testing, if needed. Tell a health care provider about: Any allergies you have. All medicines you are taking, including vitamins, herbs, eye drops, creams, and over-the-counter medicines. Any problems you or family members have had with anesthetic medicines. Any bleeding problems you have. Any surgeries you have had. Any medical conditions you have. Whether you are pregnant or may be pregnant. What are the risks? Your healthcare provider will talk with you about risks. These may include: Infection. Bleeding. Damage to other structures or organs. Allergic reactions to medicines. Collapsed lung (pneumothorax). Increased need for oxygen or difficulty breathing after the procedure. What happens before the procedure? When to stop eating and drinking Follow instructions from your health care provider about what you may eat and drink. These may include: 8 hours before your procedure Stop eating most foods. Do not eat meat, fried foods, or fatty foods. Eat only light foods, such as toast or  crackers. All liquids are okay except energy drinks and alcohol. 6 hours before your procedure Stop eating. Drink only clear liquids, such as water, clear fruit juice, black coffee, plain tea, and sports drinks. Do not drink energy drinks or alcohol. 2 hours before your procedure Stop drinking all liquids. You may be allowed to take medicines with small sips of water. If you do not follow your health care provider's instructions, your procedure may be delayed or canceled. Medicines Ask your health care provider about: Changing or stopping your regular medicines. These include any diabetes medicines or blood thinners you take. Taking medicines such as aspirin and ibuprofen. These medicines can thin your blood. Do not take them unless your health care provider tells you to. Taking over-the-counter medicines, vitamins, herbs, and supplements. General instructions You may be given antibiotic medicine to help lower the risk of infection. If you will be going home right after the procedure, plan to have a responsible adult: Take you home from the hospital or clinic. You will not be allowed to drive. Care for you for the time you are told. What happens during the procedure? An IV will be inserted into one of your veins. You will be given a medicine (local anesthetic) to numb your mouth, nose, throat, and voice box (larynx). You may also be given one or more of the following: A medicine to help you relax (sedative). A medicine to control coughing. A medicine to dry up any fluids or secretions in your lungs. A bronchoscope will be passed into your nose or mouth, and into your lungs. Your health care provider will examine your lungs. Samples of airway secretions may be collected for testing. If abnormal areas are seen in your airways, samples of  tissue may be removed and checked under a microscope (biopsy). If tissue samples are needed from the outer parts of the lung, a type of X-ray  (fluoroscopy) may be used to guide the bronchoscope to these areas. If bleeding occurs, you may be given medicine to stop or decrease the bleeding. The procedure may vary among health care providers and hospitals. What happens after the procedure? Your blood pressure, heart rate, breathing rate, and blood oxygen level will be monitored until you leave the hospital or clinic. You may have a chest X-ray to check for signs of pneumothorax. You willnot be allowed to eat or drink anything for 2 hours after your procedure. If a biopsy was taken, it is up to you to get the results of the test. Ask your health care provider, or the department that is doing the procedure, when your results will be ready. Contact a health care provider if: You have a fever. Get help right away if: You have shortness of breath that gets worse. You get light-headed or feel like you might faint. You have chest pain. You cough up more than a small amount of blood. These symptoms may be an emergency. Get help right away. Call 911. Do not wait to see if the symptoms will go away. Do not drive yourself to the hospital. Summary Flexible bronchoscopy is a procedure that allows your health care provider to look closely inside your lungs and to take testing samples if needed. Risks of flexible bronchoscopy include bleeding, infection, and collapsed lung (pneumothorax). Before the procedure, you will be given a medicine to numb your mouth, nose, throat, and voice box. Then, a bronchoscope will be passed into your nose or mouth, and into your lungs. After the procedure, your blood pressure, heart rate, breathing rate, and blood oxygen level will be monitored until you leave the hospital or clinic. You may have a chest X-ray to check for signs of pneumothorax. This information is not intended to replace advice given to you by your health care provider. Make sure you discuss any questions you have with your health care  provider. Document Revised: 11/15/2021 Document Reviewed: 11/15/2021 Elsevier Patient Education  2024 [employer].

## 2023-06-08 NOTE — H&P (View-Only) (Signed)
@Patient  ID: Frank Novak, male    DOB: 1945-09-30, 77 y.o.   MRN: [id number]  Chief Complaint  Patient presents with   Follow-up    Referring provider: Emilio Aspen, *  HPI: 77 year old male, smoked.  Past medical history significant for hypertension, arrhythmia, lung nodule, BPH, hypercholesterolemia.  06/08/2023 Patient presents today for follow-up. He was originally seen by urology for trace blood in urine. Imaging revealing lung nodule. He is well today without acute respiratory complaints. He does report having a cough 2.5 years ago. He was  given inhaler by his primary care and then saw naturopathic doctor in [place] and cough went away after course of treatment.  Denies  hemoptysis or weight loss. No unusual work exposure  PET scan showed 7.1 cm right lower lobe mass, non FDG avid, not suggestive of metastasis related to melanoma. This appearance is unusual, but indolent primary bronchogenic carcinoma remains possible. Consider bronchoscopy for tissue sampling. No findings suspicious for metastatic disease.  Dr. Tonia Brooms recommending bronchoscopy for tissue sampling, patient agreeing with plan  He is no on blood thinner. Reviewed risks of procedure. Needs liver ultrasound to evaluate hepatic cyst    Allergies  Allergen Reactions   Penicillins     Child- unsure of reaction    Tolmetin    Propofol Rash   Sulfadiazine Rash   Tetracyclines & Related Rash    Immunization History  Administered Date(s) Administered   PFIZER(Purple Top)SARS-COV-2 Vaccination 10/30/2019, 11/25/2019, 07/06/2020   Pfizer Covid-19 Vaccine Bivalent Booster 68yrs & up 06/09/2021    Past Medical History:  Diagnosis Date   BPH (benign prostatic hyperplasia)    Colon polyp    Diverticulosis    DVT (deep venous thrombosis) (HCC) 2005   LLE   Hyperlipidemia    Hypertension    Pneumonia    Skin cancer    Urinary tract infection     Tobacco History: Social History   Tobacco  Use  Smoking Status Never  Smokeless Tobacco Never   Counseling given: Not Answered   Outpatient Medications Prior to Visit  Medication Sig Dispense Refill   hydrochlorothiazide (HYDRODIURIL) 12.5 MG tablet Take 12.5 mg by mouth daily.     naproxen (NAPROSYN) 500 MG tablet Take 500 mg by mouth 2 (two) times daily with a meal.     tamsulosin (FLOMAX) 0.4 MG CAPS capsule Take 1 capsule by mouth as needed.     No facility-administered medications prior to visit.      Review of Systems  Review of Systems  Constitutional: Negative.  Negative for fatigue, fever and unexpected weight change.  HENT: Negative.    Respiratory:  Negative for cough and shortness of breath.   Cardiovascular: Negative.    Physical Exam  BP 130/70 (BP Location: Right Arm, Patient Position: Sitting, Cuff Size: Normal)   Pulse (!) 56   Temp 97.8 F (36.6 C) (Oral)   Ht 5' 9.5" (1.765 m)   Wt 189 lb (85.7 kg)   SpO2 97%   BMI 27.51 kg/m  Physical Exam Constitutional:      Appearance: Normal appearance.     Comments: Appears well   HENT:     Head: Normocephalic and atraumatic.  Cardiovascular:     Rate and Rhythm: Normal rate and regular rhythm.  Pulmonary:     Effort: Pulmonary effort is normal.     Breath sounds: Normal breath sounds.  Neurological:     General: No focal deficit present.  Mental Status: He is alert and oriented to person, place, and time. Mental status is at baseline.  Psychiatric:        Mood and Affect: Mood normal.        Behavior: Behavior normal.        Thought Content: Thought content normal.        Judgment: Judgment normal.      Lab Results:  CBC No results found for: "WBC", "RBC", "HGB", "HCT", "PLT", "MCV", "MCH", "MCHC", "RDW", "LYMPHSABS", "MONOABS", "EOSABS", "BASOSABS"  BMET No results found for: "NA", "K", "CL", "CO2", "GLUCOSE", "BUN", "CREATININE", "CALCIUM", "GFRNONAA", "GFRAA"  BNP No results found for: "BNP"  ProBNP No results found for:  "PROBNP"  Imaging: ECHOCARDIOGRAM COMPLETE  Result Date: 05/18/2023    ECHOCARDIOGRAM REPORT   Patient Name:   Frank Novak Date of Exam: 05/18/2023 Medical Rec #:  [id number]    Height:       69.5 in Accession #:    [id number]   Weight:       196.2 lb Date of Birth:  09-Apr-1946   BSA:          2.059 m Patient Age:    15 years     BP:           128/70 mmHg Patient Gender: M            HR:           61 bpm. Exam Location:  Outpatient Procedure: 2D Echo, 3D Echo, Cardiac Doppler and Color Doppler Indications:    R01.1 Murmur  History:        Patient has no prior history of Echocardiogram examinations.                 Signs/Symptoms:Murmur and Altered Mental Status; Risk                 Factors:Hypertension, Dyslipidemia and Non-Smoker. Patient                 denies chest pain, SOB and leg edema. He has history of melanoma                 with lung nodule. No history of chemotherapy/radiation therapy.  Sonographer:    Carlos American RVT, RDCS (AE), RDMS Referring Phys: [id number] BRADLEY L ICARD  Sonographer Comments: Global longitudinal strain was attempted. IMPRESSIONS  1. Left ventricular ejection fraction, by estimation, is 60 to 65%. Left ventricular ejection fraction by 3D volume is 62 %. The left ventricle has normal function. The left ventricle has no regional wall motion abnormalities. Left ventricular diastolic  parameters were normal.  2. Right ventricular systolic function is normal. The right ventricular size is normal. There is normal pulmonary artery systolic pressure. The estimated right ventricular systolic pressure is 12.2 mmHg.  3. Left atrial size was mildly dilated.  4. The mitral valve is normal in structure. No evidence of mitral valve regurgitation. No evidence of mitral stenosis.  5. The aortic valve is normal in structure. There is moderate calcification of the aortic valve. There is moderate thickening of the aortic valve. Aortic valve regurgitation is mild. Aortic valve  sclerosis/calcification is present, without any evidence of aortic stenosis. Aortic valve area, by VTI measures 1.42 cm. Aortic valve mean gradient measures 8.3 mmHg. Aortic valve Vmax measures 2.26 m/s. AVA is underestimated due to small LVOT measurement.  6. The inferior vena cava is dilated in size with >50% respiratory variability, suggesting right atrial pressure of  8 mmHg. 7. Cystic stucture in the liver measuring 2.12 x 2.85cm. Consider dedicated liver US. FINDINGS  Left Ventricle: Left ventricular ejection fraction, by estimation, is 60 to 65%. Left ventricular ejection fraction by 3D volume is 62 %. The left ventricle has normal function. The left ventricle has no regional wall motion abnormalities. The left ventricular internal cavity size was normal in size. There is no left ventricular hypertrophy. Left ventricular diastolic parameters were normal. Normal left ventricular filling pressure. Right Ventricle: The right ventricular size is normal. No increase in right ventricular wall thickness. Right ventricular systolic function is normal. There is normal pulmonary artery systolic pressure. The tricuspid regurgitant velocity is 1.02 m/s, and  with an assumed right atrial pressure of 8 mmHg, the estimated right ventricular systolic pressure is 12.2 mmHg. Left Atrium: Left atrial size was mildly dilated. Right Atrium: Right atrial size was normal in size. Pericardium: There is no evidence of pericardial effusion. Mitral Valve: The mitral valve is normal in structure. Mild mitral annular calcification. No evidence of mitral valve regurgitation. No evidence of mitral valve stenosis. Tricuspid Valve: The tricuspid valve is normal in structure. Tricuspid valve regurgitation is trivial. No evidence of tricuspid stenosis. Aortic Valve: The aortic valve is normal in structure. There is moderate calcification of the aortic valve. There is moderate thickening of the aortic valve. Aortic valve regurgitation is mild.  Aortic valve sclerosis/calcification is present, without any  evidence of aortic stenosis. Aortic valve mean gradient measures 8.3 mmHg. Aortic valve peak gradient measures 20.5 mmHg. Aortic valve area, by VTI measures 1.42 cm. Pulmonic Valve: The pulmonic valve was normal in structure. Pulmonic valve regurgitation is not visualized. No evidence of pulmonic stenosis. Aorta: The aortic root is normal in size and structure. Venous: The inferior vena cava is dilated in size with greater than 50% respiratory variability, suggesting right atrial pressure of 8 mmHg. IAS/Shunts: No atrial level shunt detected by color flow Doppler.  LEFT VENTRICLE PLAX 2D LVIDd:         4.94 cm         Diastology LVIDs:         3.08 cm         LV e' medial:    9.41 cm/s LV PW:         1.10 cm         LV E/e' medial:  9.3 LV IVS:        2.31 cm         LV e' lateral:   13.60 cm/s LVOT diam:     1.90 cm         LV E/e' lateral: 6.4 LV SV:         66 LV SV Index:   32 LVOT Area:     2.84 cm        3D Volume EF                                LV 3D EF:    Left                                             ventricul  ar                                             ejection                                             fraction                                             by 3D                                             volume is                                             62 %.                                 3D Volume EF:                                3D EF:        62 %                                LV EDV:       155 ml                                LV ESV:       59 ml                                LV SV:        96 ml RIGHT VENTRICLE RV S prime:     14.40 cm/s TAPSE (M-mode): 2.6 cm LEFT ATRIUM             Index        RIGHT ATRIUM           Index LA diam:        4.30 cm 2.09 cm/m   RA Area:     13.40 cm LA Vol (A2C):   81.1 ml 39.38 ml/m  RA Volume:   28.30 ml  13.74 ml/m LA Vol (A4C):   66.5 ml  32.29 ml/m LA Biplane Vol: 75.6 ml 36.71 ml/m  AORTIC VALVE                     PULMONIC VALVE AV Area (Vmax):    1.35 cm      PV Vmax:       1.14 m/s AV Area (Vmean):   1.22 cm      PV  Peak grad:  5.2 mmHg AV Area (VTI):     1.42 cm AV Vmax:           226.33 cm/s AV Vmean:          131.333 cm/s AV VTI:            0.468 m AV Peak Grad:      20.5 mmHg AV Mean Grad:      8.3 mmHg LVOT Vmax:         108.00 cm/s LVOT Vmean:        56.600 cm/s LVOT VTI:          0.234 m LVOT/AV VTI ratio: 0.50 AR Vena Contracta: 0.25 cm  AORTA Ao Root diam: 3.50 cm Ao Asc diam:  3.40 cm Ao Arch diam: 3.0 cm MITRAL VALVE               TRICUSPID VALVE MV Area (PHT): 4.63 cm    TR Peak grad:   4.2 mmHg MV Decel Time: 164 msec    TR Vmax:        102.00 cm/s MV E velocity: 87.40 cm/s MV A velocity: 99.50 cm/s  SHUNTS MV E/A ratio:  0.88        Systemic VTI:  0.23 m                            Systemic Diam: 1.90 cm Armanda Magic MD Electronically signed by Armanda Magic MD Signature Date/Time: 05/18/2023/10:49:14 AM    Final      Assessment & Plan:   Lung nodule - PET scan showed 7.1 cm right lower lobe mass, non FDG avid, not suggestive of metastasis related to melanoma. This appearance is unusual, but indolent primary bronchogenic carcinoma remains possible. Consider bronchoscopy for tissue sampling. No findings suspicious for metastatic disease. Hepatic cyst measuring 2cm.  - We discussed CT imaging results and recommendations for tissue sampling via bronchoscopy.  Patient in agreement with proceeding with procedure for further evaluation of right lower lung mass. Bronchoscopy will be set for 06/18/23 with Dr. Tonia Brooms. He is not on blood thinner. Patient education on bronchoscopy procedure given. Reviewed risks of procedure. Needs liver ultrasound to evaluate hepatic cyst   Orders: Liver ultrasound Bronchoscopy with Dr. Tonia Brooms, planning for 06/18/23  Follow-up: 4 week virtual visit with either Dr. Tonia Brooms or Waynetta Sandy NP (Friday  afternoon ok)    Glenford Bayley, NP 06/11/2023

## 2023-06-08 NOTE — Progress Notes (Unsigned)
@Patient  ID: Frank Novak, male    DOB: 1945-09-30, 77 y.o.   MRN: [id number]  Chief Complaint  Patient presents with   Follow-up    Referring provider: Emilio Novak, *  HPI: 77 year old male, smoked.  Past medical history significant for hypertension, arrhythmia, lung nodule, BPH, hypercholesterolemia.  06/08/2023 Patient presents today for follow-up. He was originally seen by urology for trace blood in urine. Imaging revealing lung nodule. He is well today without acute respiratory complaints. He does report having a cough 2.5 years ago. He was  given inhaler by his primary care and then saw naturopathic doctor in [place] and cough went away after course of treatment.  Denies  hemoptysis or weight loss. No unusual work exposure  PET scan showed 7.1 cm right lower lobe mass, non FDG avid, not suggestive of metastasis related to melanoma. This appearance is unusual, but indolent primary bronchogenic carcinoma remains possible. Consider bronchoscopy for tissue sampling. No findings suspicious for metastatic disease.  Dr. Tonia Novak recommending bronchoscopy for tissue sampling, patient agreeing with plan  He is no on blood thinner. Reviewed risks of procedure. Needs liver ultrasound to evaluate hepatic cyst    Allergies  Allergen Reactions   Penicillins     Child- unsure of reaction    Tolmetin    Propofol Rash   Sulfadiazine Rash   Tetracyclines & Related Rash    Immunization History  Administered Date(s) Administered   PFIZER(Purple Top)SARS-COV-2 Vaccination 10/30/2019, 11/25/2019, 07/06/2020   Pfizer Covid-19 Vaccine Bivalent Booster 68yrs & up 06/09/2021    Past Medical History:  Diagnosis Date   BPH (benign prostatic hyperplasia)    Colon polyp    Diverticulosis    DVT (deep venous thrombosis) (HCC) 2005   LLE   Hyperlipidemia    Hypertension    Pneumonia    Skin cancer    Urinary tract infection     Tobacco History: Social History   Tobacco  Use  Smoking Status Never  Smokeless Tobacco Never   Counseling given: Not Answered   Outpatient Medications Prior to Visit  Medication Sig Dispense Refill   hydrochlorothiazide (HYDRODIURIL) 12.5 MG tablet Take 12.5 mg by mouth daily.     naproxen (NAPROSYN) 500 MG tablet Take 500 mg by mouth 2 (two) times daily with a meal.     tamsulosin (FLOMAX) 0.4 MG CAPS capsule Take 1 capsule by mouth as needed.     No facility-administered medications prior to visit.      Review of Systems  Review of Systems  Constitutional: Negative.  Negative for fatigue, fever and unexpected weight change.  HENT: Negative.    Respiratory:  Negative for cough and shortness of breath.   Cardiovascular: Negative.    Physical Exam  BP 130/70 (BP Location: Right Arm, Patient Position: Sitting, Cuff Size: Normal)   Pulse (!) 56   Temp 97.8 F (36.6 C) (Oral)   Ht 5' 9.5" (1.765 m)   Wt 189 lb (85.7 kg)   SpO2 97%   BMI 27.51 kg/m  Physical Exam Constitutional:      Appearance: Normal appearance.     Comments: Appears well   HENT:     Head: Normocephalic and atraumatic.  Cardiovascular:     Rate and Rhythm: Normal rate and regular rhythm.  Pulmonary:     Effort: Pulmonary effort is normal.     Breath sounds: Normal breath sounds.  Neurological:     General: No focal deficit present.  Mental Status: He is alert and oriented to person, place, and time. Mental status is at baseline.  Psychiatric:        Mood and Affect: Mood normal.        Behavior: Behavior normal.        Thought Content: Thought content normal.        Judgment: Judgment normal.      Lab Results:  CBC No results found for: "WBC", "RBC", "HGB", "HCT", "PLT", "MCV", "MCH", "MCHC", "RDW", "LYMPHSABS", "MONOABS", "EOSABS", "BASOSABS"  BMET No results found for: "NA", "K", "CL", "CO2", "GLUCOSE", "BUN", "CREATININE", "CALCIUM", "GFRNONAA", "GFRAA"  BNP No results found for: "BNP"  ProBNP No results found for:  "PROBNP"  Imaging: ECHOCARDIOGRAM COMPLETE  Result Date: 05/18/2023    ECHOCARDIOGRAM REPORT   Patient Name:   Frank Novak Date of Exam: 05/18/2023 Medical Rec #:  [id number]    Height:       69.5 in Accession #:    [id number]   Weight:       196.2 lb Date of Birth:  09-Apr-1946   BSA:          2.059 m Patient Age:    15 years     BP:           128/70 mmHg Patient Gender: M            HR:           61 bpm. Exam Location:  Outpatient Procedure: 2D Echo, 3D Echo, Cardiac Doppler and Color Doppler Indications:    R01.1 Murmur  History:        Patient has no prior history of Echocardiogram examinations.                 Signs/Symptoms:Murmur and Altered Mental Status; Risk                 Factors:Hypertension, Dyslipidemia and Non-Smoker. Patient                 denies chest pain, SOB and leg edema. He has history of melanoma                 with lung nodule. No history of chemotherapy/radiation therapy.  Sonographer:    Frank Novak RVT, RDCS (AE), RDMS Referring Phys: [id number] Frank Novak  Sonographer Comments: Global longitudinal strain was attempted. IMPRESSIONS  1. Left ventricular ejection fraction, by estimation, is 60 to 65%. Left ventricular ejection fraction by 3D volume is 62 %. The left ventricle has normal function. The left ventricle has no regional wall motion abnormalities. Left ventricular diastolic  parameters were normal.  2. Right ventricular systolic function is normal. The right ventricular size is normal. There is normal pulmonary artery systolic pressure. The estimated right ventricular systolic pressure is 12.2 mmHg.  3. Left atrial size was mildly dilated.  4. The mitral valve is normal in structure. No evidence of mitral valve regurgitation. No evidence of mitral stenosis.  5. The aortic valve is normal in structure. There is moderate calcification of the aortic valve. There is moderate thickening of the aortic valve. Aortic valve regurgitation is mild. Aortic valve  sclerosis/calcification is present, without any evidence of aortic stenosis. Aortic valve area, by VTI measures 1.42 cm. Aortic valve mean gradient measures 8.3 mmHg. Aortic valve Vmax measures 2.26 m/s. AVA is underestimated due to small LVOT measurement.  6. The inferior vena cava is dilated in size with >50% respiratory variability, suggesting right atrial pressure of  8 mmHg. 7. Cystic stucture in the liver measuring 2.12 x 2.85cm. Consider dedicated liver US. FINDINGS  Left Ventricle: Left ventricular ejection fraction, by estimation, is 60 to 65%. Left ventricular ejection fraction by 3D volume is 62 %. The left ventricle has normal function. The left ventricle has no regional wall motion abnormalities. The left ventricular internal cavity size was normal in size. There is no left ventricular hypertrophy. Left ventricular diastolic parameters were normal. Normal left ventricular filling pressure. Right Ventricle: The right ventricular size is normal. No increase in right ventricular wall thickness. Right ventricular systolic function is normal. There is normal pulmonary artery systolic pressure. The tricuspid regurgitant velocity is 1.02 m/s, and  with an assumed right atrial pressure of 8 mmHg, the estimated right ventricular systolic pressure is 12.2 mmHg. Left Atrium: Left atrial size was mildly dilated. Right Atrium: Right atrial size was normal in size. Pericardium: There is no evidence of pericardial effusion. Mitral Valve: The mitral valve is normal in structure. Mild mitral annular calcification. No evidence of mitral valve regurgitation. No evidence of mitral valve stenosis. Tricuspid Valve: The tricuspid valve is normal in structure. Tricuspid valve regurgitation is trivial. No evidence of tricuspid stenosis. Aortic Valve: The aortic valve is normal in structure. There is moderate calcification of the aortic valve. There is moderate thickening of the aortic valve. Aortic valve regurgitation is mild.  Aortic valve sclerosis/calcification is present, without any  evidence of aortic stenosis. Aortic valve mean gradient measures 8.3 mmHg. Aortic valve peak gradient measures 20.5 mmHg. Aortic valve area, by VTI measures 1.42 cm. Pulmonic Valve: The pulmonic valve was normal in structure. Pulmonic valve regurgitation is not visualized. No evidence of pulmonic stenosis. Aorta: The aortic root is normal in size and structure. Venous: The inferior vena cava is dilated in size with greater than 50% respiratory variability, suggesting right atrial pressure of 8 mmHg. IAS/Shunts: No atrial level shunt detected by color flow Doppler.  LEFT VENTRICLE PLAX 2D LVIDd:         4.94 cm         Diastology LVIDs:         3.08 cm         LV e' medial:    9.41 cm/s LV PW:         1.10 cm         LV E/e' medial:  9.3 LV IVS:        2.31 cm         LV e' lateral:   13.60 cm/s LVOT diam:     1.90 cm         LV E/e' lateral: 6.4 LV SV:         66 LV SV Index:   32 LVOT Area:     2.84 cm        3D Volume EF                                LV 3D EF:    Left                                             ventricul  ar                                             ejection                                             fraction                                             by 3D                                             volume is                                             62 %.                                 3D Volume EF:                                3D EF:        62 %                                LV EDV:       155 ml                                LV ESV:       59 ml                                LV SV:        96 ml RIGHT VENTRICLE RV S prime:     14.40 cm/s TAPSE (M-mode): 2.6 cm LEFT ATRIUM             Index        RIGHT ATRIUM           Index LA diam:        4.30 cm 2.09 cm/m   RA Area:     13.40 cm LA Vol (A2C):   81.1 ml 39.38 ml/m  RA Volume:   28.30 ml  13.74 ml/m LA Vol (A4C):   66.5 ml  32.29 ml/m LA Biplane Vol: 75.6 ml 36.71 ml/m  AORTIC VALVE                     PULMONIC VALVE AV Area (Vmax):    1.35 cm      PV Vmax:       1.14 m/s AV Area (Vmean):   1.22 cm      PV  Peak grad:  5.2 mmHg AV Area (VTI):     1.42 cm AV Vmax:           226.33 cm/s AV Vmean:          131.333 cm/s AV VTI:            0.468 m AV Peak Grad:      20.5 mmHg AV Mean Grad:      8.3 mmHg LVOT Vmax:         108.00 cm/s LVOT Vmean:        56.600 cm/s LVOT VTI:          0.234 m LVOT/AV VTI ratio: 0.50 AR Vena Contracta: 0.25 cm  AORTA Ao Root diam: 3.50 cm Ao Asc diam:  3.40 cm Ao Arch diam: 3.0 cm MITRAL VALVE               TRICUSPID VALVE MV Area (PHT): 4.63 cm    TR Peak grad:   4.2 mmHg MV Decel Time: 164 msec    TR Vmax:        102.00 cm/s MV E velocity: 87.40 cm/s MV A velocity: 99.50 cm/s  SHUNTS MV E/A ratio:  0.88        Systemic VTI:  0.23 m                            Systemic Diam: 1.90 cm Armanda Magic MD Electronically signed by Armanda Magic MD Signature Date/Time: 05/18/2023/10:49:14 AM    Final      Assessment & Plan:   Lung nodule - PET scan showed 7.1 cm right lower lobe mass, non FDG avid, not suggestive of metastasis related to melanoma. This appearance is unusual, but indolent primary bronchogenic carcinoma remains possible. Consider bronchoscopy for tissue sampling. No findings suspicious for metastatic disease. Hepatic cyst measuring 2cm.  - We discussed CT imaging results and recommendations for tissue sampling via bronchoscopy.  Patient in agreement with proceeding with procedure for further evaluation of right lower lung mass. Bronchoscopy will be set for 06/18/23 with Dr. Tonia Novak. He is not on blood thinner. Patient education on bronchoscopy procedure given. Reviewed risks of procedure. Needs liver ultrasound to evaluate hepatic cyst   Orders: Liver ultrasound Bronchoscopy with Dr. Tonia Novak, planning for 06/18/23  Follow-up: 4 week virtual visit with either Dr. Tonia Novak or Waynetta Sandy NP (Friday  afternoon ok)    Glenford Bayley, NP 06/11/2023

## 2023-06-08 NOTE — Progress Notes (Signed)
[name], you are seeing this patient today.  He will need bronchoscopy and biopsy.  Please schedule with me on 06/18/2023.  Thanks,  BLI  Josephine Igo, DO [facility] 06/08/2023 10:02 AM

## 2023-06-11 DIAGNOSIS — R911 Solitary pulmonary nodule: Secondary | ICD-10-CM | POA: Insufficient documentation

## 2023-06-11 NOTE — Assessment & Plan Note (Addendum)
-   PET scan showed 7.1 cm right lower lobe mass, non FDG avid, not suggestive of metastasis related to melanoma. This appearance is unusual, but indolent primary bronchogenic carcinoma remains possible. Consider bronchoscopy for tissue sampling. No findings suspicious for metastatic disease. Hepatic cyst measuring 2cm.  - We discussed CT imaging results and recommendations for tissue sampling via bronchoscopy.  Patient in agreement with proceeding with procedure for further evaluation of right lower lung mass. Bronchoscopy will be set for 06/18/23 with Dr. Tonia Brooms. He is not on blood thinner. Patient education on bronchoscopy procedure given. Reviewed risks of procedure. Needs liver ultrasound to evaluate hepatic cyst   Orders: Liver ultrasound Bronchoscopy with Dr. Tonia Brooms, planning for 06/18/23  Follow-up: 4 week virtual visit with either Dr. Tonia Brooms or Waynetta Sandy NP (Friday afternoon ok)

## 2023-06-12 ENCOUNTER — Encounter: Payer: Self-pay | Admitting: Pulmonary Disease

## 2023-06-12 NOTE — Addendum Note (Signed)
Addended by: Glenford Bayley on: 06/12/2023 09:31 AM   Modules accepted: Orders

## 2023-06-14 ENCOUNTER — Ambulatory Visit
Admission: RE | Admit: 2023-06-14 | Discharge: 2023-06-14 | Disposition: A | Discharge status: Home / Self Care | Payer: Medicare Other | Source: Ambulatory Visit | Attending: Primary Care | Admitting: Primary Care

## 2023-06-14 ENCOUNTER — Telehealth: Payer: Self-pay | Admitting: Pulmonary Disease

## 2023-06-14 DIAGNOSIS — J984 Other disorders of lung: Secondary | ICD-10-CM | POA: Diagnosis not present

## 2023-06-14 DIAGNOSIS — R918 Other nonspecific abnormal finding of lung field: Secondary | ICD-10-CM | POA: Insufficient documentation

## 2023-06-14 DIAGNOSIS — K7689 Other specified diseases of liver: Secondary | ICD-10-CM | POA: Insufficient documentation

## 2023-06-14 NOTE — Telephone Encounter (Signed)
Lmam for patient I have got it resc to 07/03/23 but will wait for the patient to call back

## 2023-06-15 ENCOUNTER — Other Ambulatory Visit: Payer: Medicare Other

## 2023-06-15 ENCOUNTER — Other Ambulatory Visit (HOSPITAL_COMMUNITY): Payer: Medicare Other

## 2023-06-15 NOTE — Telephone Encounter (Signed)
Patient aware of the appt info

## 2023-06-18 ENCOUNTER — Encounter (HOSPITAL_COMMUNITY): Payer: Self-pay

## 2023-06-18 ENCOUNTER — Ambulatory Visit (HOSPITAL_COMMUNITY): Admit: 2023-06-18 | Payer: Medicare Other | Admitting: Pulmonary Disease

## 2023-06-18 SURGERY — BRONCHOSCOPY, WITH BIOPSY USING ELECTROMAGNETIC NAVIGATION
Anesthesia: General | Laterality: Right

## 2023-06-26 ENCOUNTER — Ambulatory Visit: Payer: Medicare Other | Admitting: Acute Care

## 2023-07-02 ENCOUNTER — Encounter (HOSPITAL_COMMUNITY): Payer: Self-pay | Admitting: Pulmonary Disease

## 2023-07-02 NOTE — Progress Notes (Signed)
SDW call  Patient was given pre-op instructions over the phone. Patient verbalized understanding of instructions provided.     PCP - Dr. Eleanora Neighbor Cardiologist -  Pulmonary:    PPM/ICD - denies Device Orders - na Rep Notified - na   Chest x-ray - 09/19/2019 EKG -  04/28/2023 Stress Test - ECHO - 05/18/2023 Cardiac Cath -   Sleep Study/sleep apnea/CPAP: denies  Non-diabetic  Blood Thinner Instructions: denies Aspirin Instructions:denies   ERAS Protcol - NPO  COVID TEST- na   Anesthesia review: No   Patient denies shortness of breath, fever, cough and chest pain over the phone call  Your procedure is scheduled on Tuesday July 03, 2023  Report to [facility] Main Entrance "A" at  0730  A.M., then check in with the Admitting office.  Call this number if you have problems the morning of surgery:  [phone]   If you have any questions prior to your surgery date call [phone]: Open Monday-Friday 8am-4pm If you experience any cold or flu symptoms such as cough, fever, chills, shortness of breath, etc. between now and your scheduled surgery, please notify us at the above number     Remember:  Do not eat or drink  after midnight the night before your surgery  Take these medicines the morning of surgery with A SIP OF WATER:  Flomax  As of today, STOP taking any Aspirin (unless otherwise instructed by your surgeon) Aleve, Naproxen, Ibuprofen, Motrin, Advil, Goody's, BC's, all herbal medications, fish oil, and all vitamins.

## 2023-07-03 ENCOUNTER — Encounter (HOSPITAL_COMMUNITY): Payer: Self-pay | Admitting: Pulmonary Disease

## 2023-07-03 ENCOUNTER — Other Ambulatory Visit: Payer: Self-pay

## 2023-07-03 ENCOUNTER — Ambulatory Visit (HOSPITAL_COMMUNITY)
Admission: RE | Admit: 2023-07-03 | Discharge: 2023-07-03 | Disposition: A | Discharge status: Home / Self Care | Payer: Medicare Other | Attending: Pulmonary Disease | Admitting: Pulmonary Disease

## 2023-07-03 ENCOUNTER — Ambulatory Visit (HOSPITAL_BASED_OUTPATIENT_CLINIC_OR_DEPARTMENT_OTHER): Payer: Medicare Other | Admitting: Registered Nurse

## 2023-07-03 ENCOUNTER — Encounter (HOSPITAL_COMMUNITY)
Admission: RE | Disposition: A | Discharge status: Home / Self Care | Payer: Self-pay | Source: Home / Self Care | Attending: Pulmonary Disease

## 2023-07-03 ENCOUNTER — Ambulatory Visit (HOSPITAL_COMMUNITY): Payer: Medicare Other | Admitting: Registered Nurse

## 2023-07-03 ENCOUNTER — Ambulatory Visit (HOSPITAL_COMMUNITY): Payer: Medicare Other

## 2023-07-03 DIAGNOSIS — I10 Essential (primary) hypertension: Secondary | ICD-10-CM | POA: Insufficient documentation

## 2023-07-03 DIAGNOSIS — K7689 Other specified diseases of liver: Secondary | ICD-10-CM | POA: Diagnosis not present

## 2023-07-03 DIAGNOSIS — Z86718 Personal history of other venous thrombosis and embolism: Secondary | ICD-10-CM | POA: Diagnosis not present

## 2023-07-03 DIAGNOSIS — R918 Other nonspecific abnormal finding of lung field: Secondary | ICD-10-CM

## 2023-07-03 DIAGNOSIS — Z85820 Personal history of malignant melanoma of skin: Secondary | ICD-10-CM | POA: Diagnosis not present

## 2023-07-03 DIAGNOSIS — R911 Solitary pulmonary nodule: Secondary | ICD-10-CM | POA: Diagnosis present

## 2023-07-03 DIAGNOSIS — Z48813 Encounter for surgical aftercare following surgery on the respiratory system: Secondary | ICD-10-CM | POA: Diagnosis not present

## 2023-07-03 DIAGNOSIS — J9811 Atelectasis: Secondary | ICD-10-CM | POA: Diagnosis not present

## 2023-07-03 DIAGNOSIS — R846 Abnormal cytological findings in specimens from respiratory organs and thorax: Secondary | ICD-10-CM | POA: Diagnosis not present

## 2023-07-03 DIAGNOSIS — I700 Atherosclerosis of aorta: Secondary | ICD-10-CM | POA: Insufficient documentation

## 2023-07-03 DIAGNOSIS — Z85828 Personal history of other malignant neoplasm of skin: Secondary | ICD-10-CM | POA: Insufficient documentation

## 2023-07-03 HISTORY — PX: BRONCHIAL NEEDLE ASPIRATION BIOPSY: SHX5106

## 2023-07-03 HISTORY — PX: BRONCHIAL BRUSHINGS: SHX5108

## 2023-07-03 HISTORY — DX: Other complications of anesthesia, initial encounter: T88.59XA

## 2023-07-03 HISTORY — PX: BRONCHIAL BIOPSY: SHX5109

## 2023-07-03 LAB — POCT I-STAT, CHEM 8
BUN: 21 mg/dL (ref 8–23)
Calcium, Ion: 1.1 mmol/L — ABNORMAL LOW (ref 1.15–1.40)
Chloride: 101 mmol/L (ref 98–111)
Creatinine, Ser: 0.9 mg/dL (ref 0.61–1.24)
Glucose, Bld: 106 mg/dL — ABNORMAL HIGH (ref 70–99)
HCT: 42 % (ref 39.0–52.0)
Hemoglobin: 14.3 g/dL (ref 13.0–17.0)
Potassium: 4.1 mmol/L (ref 3.5–5.1)
Sodium: 140 mmol/L (ref 135–145)
TCO2: 30 mmol/L (ref 22–32)

## 2023-07-03 SURGERY — BRONCHOSCOPY, WITH BIOPSY USING ELECTROMAGNETIC NAVIGATION
Anesthesia: General | Laterality: Right

## 2023-07-03 MED ORDER — LIDOCAINE 2% (20 MG/ML) 5 ML SYRINGE
INTRAMUSCULAR | Status: DC | PRN
  Administered 2023-07-03: 100 mg via INTRAVENOUS

## 2023-07-03 MED ORDER — FENTANYL CITRATE (PF) 100 MCG/2ML IJ SOLN
INTRAMUSCULAR | Status: AC
  Filled 2023-07-03: qty 2

## 2023-07-03 MED ORDER — CHLORHEXIDINE GLUCONATE 0.12 % MT SOLN: 15.0000 mL | Freq: Once | OROMUCOSAL | Status: AC

## 2023-07-03 MED ORDER — ACETAMINOPHEN 10 MG/ML IV SOLN: 1000.0000 mg | Freq: Once | INTRAVENOUS | Status: DC | PRN

## 2023-07-03 MED ORDER — FENTANYL CITRATE (PF) 250 MCG/5ML IJ SOLN
INTRAMUSCULAR | Status: DC | PRN
  Administered 2023-07-03: 100 ug via INTRAVENOUS

## 2023-07-03 MED ORDER — OXYCODONE HCL 5 MG PO TABS: 5.0000 mg | ORAL_TABLET | Freq: Once | ORAL | Status: DC | PRN

## 2023-07-03 MED ORDER — OXYCODONE HCL 5 MG/5ML PO SOLN: 5.0000 mg | Freq: Once | ORAL | Status: DC | PRN

## 2023-07-03 MED ORDER — PROPOFOL 10 MG/ML IV BOLUS
INTRAVENOUS | Status: DC | PRN
  Administered 2023-07-03: 150 mg via INTRAVENOUS

## 2023-07-03 MED ORDER — SODIUM CHLORIDE 0.9% FLUSH: 10.0000 mL | Freq: Two times a day (BID) | INTRAVENOUS | Status: DC

## 2023-07-03 MED ORDER — FENTANYL CITRATE (PF) 100 MCG/2ML IJ SOLN: 25.0000 ug | INTRAMUSCULAR | Status: DC | PRN

## 2023-07-03 MED ORDER — SUGAMMADEX SODIUM 200 MG/2ML IV SOLN
INTRAVENOUS | Status: DC | PRN
  Administered 2023-07-03: 344.8 mg via INTRAVENOUS

## 2023-07-03 MED ORDER — ROCURONIUM BROMIDE 10 MG/ML (PF) SYRINGE
PREFILLED_SYRINGE | INTRAVENOUS | Status: DC | PRN
  Administered 2023-07-03: 60 mg via INTRAVENOUS

## 2023-07-03 MED ORDER — PHENYLEPHRINE HCL-NACL 20-0.9 MG/250ML-% IV SOLN
INTRAVENOUS | Status: DC | PRN
  Administered 2023-07-03: 10 ug/min via INTRAVENOUS

## 2023-07-03 MED ORDER — CHLORHEXIDINE GLUCONATE 0.12 % MT SOLN
OROMUCOSAL | Status: AC
  Administered 2023-07-03: 15 mL via OROMUCOSAL
  Filled 2023-07-03: qty 15

## 2023-07-03 MED ORDER — ONDANSETRON HCL 4 MG/2ML IJ SOLN
INTRAMUSCULAR | Status: DC | PRN
  Administered 2023-07-03: 4 mg via INTRAVENOUS

## 2023-07-03 MED ORDER — DEXAMETHASONE SODIUM PHOSPHATE 10 MG/ML IJ SOLN
INTRAMUSCULAR | Status: DC | PRN
  Administered 2023-07-03: 10 mg via INTRAVENOUS

## 2023-07-03 MED ORDER — DROPERIDOL 2.5 MG/ML IJ SOLN: 0.6250 mg | Freq: Once | INTRAMUSCULAR | Status: DC | PRN

## 2023-07-03 MED ORDER — LACTATED RINGERS IV SOLN: INTRAVENOUS | Status: DC

## 2023-07-03 NOTE — Progress Notes (Signed)
Cxr reviewed no complicating factors (I had Dr Wynona Neat also eval) At bedside Mr Snay is resting comfortably, he remains on room air.  Sats mid 90s. Some faint crackles bases but otherwise exam unremarkable   He can be discharged to home as planned  Simonne Martinet ACNP-BC [facility] Pulmonary/Critical Care Pager # [phone] OR # [phone] if no answer

## 2023-07-03 NOTE — Anesthesia Preprocedure Evaluation (Addendum)
Anesthesia Evaluation  Patient identified by MRN, date of birth, ID band Patient awake    Reviewed: Allergy & Precautions, H&P , NPO status , Patient's Chart, lab work & pertinent test results  Airway Mallampati: II  TM Distance: >3 FB Neck ROM: Full    Dental no notable dental hx.    Pulmonary neg pulmonary ROS RLL mass   Pulmonary exam normal breath sounds clear to auscultation       Cardiovascular hypertension, Normal cardiovascular exam Rhythm:Regular Rate:Normal     Neuro/Psych negative neurological ROS  negative psych ROS   GI/Hepatic negative GI ROS, Neg liver ROS,,,  Endo/Other  negative endocrine ROS    Renal/GU negative Renal ROS  negative genitourinary   Musculoskeletal negative musculoskeletal ROS (+)    Abdominal   Peds negative pediatric ROS (+)  Hematology negative hematology ROS (+) Hx of DVT   Anesthesia Other Findings   Reproductive/Obstetrics negative OB ROS                             Anesthesia Physical Anesthesia Plan  ASA: 3  Anesthesia Plan: General   Post-op Pain Management:    Induction: Intravenous  PONV Risk Score and Plan: Ondansetron and Dexamethasone  Airway Management Planned: Oral ETT  Additional Equipment:   Intra-op Plan:   Post-operative Plan: Extubation in OR  Informed Consent: I have reviewed the patients History and Physical, chart, labs and discussed the procedure including the risks, benefits and alternatives for the proposed anesthesia with the patient or authorized representative who has indicated his/her understanding and acceptance.     Dental advisory given  Plan Discussed with: CRNA  Anesthesia Plan Comments:         Anesthesia Quick Evaluation

## 2023-07-03 NOTE — Research (Signed)
Title: A multi-center, prospective, single-arm, observational study to evaluate real-world outcomes for the shape-sensing Ion endoluminal system  Primary Outcome: Evaluate procedure characteristics and short and long-term patient outcomes following shape-sensing robotic-assisted bronchoscopy (ssRAB) utilizing the Ion Endoluminal System for lung lesion localization or biopsy.   Protocol # / Study Name: ISI-ION-003 Clinical Trials #: [id number] Sponsor: [employer] Principal Investigator: Dr. Elige Radon Icard   Key Features of Ion Endoluminal System (referred to as "Ion") Ion is the first FDA cleared bronchoscopy system that uses fiber optic shape sensing technology to inform on location within the airways. Its catheter/tool channel has a smaller outer diameter (3.5 mm) in comparison to conventional bronchoscopes, allowing it to navigate into the smaller airways of the periphery.     Key Inclusion Criteria Subject is 18 years or older at the time of the procedure Subject is a candidate for a planned, elective RAB lung lesion localization or biopsy procedure in which the Ion Endoluminal System is planned to be utilized.  Subject  able to understand and adhere to study requirements and provide informed consent.   Key Exclusion Criteria Subject is under the care of a Museum/gallery exhibitions officer and is unable to provide informed consent on their own accord.  Subject is participating in an interventional research study or research study with investigational agents with an unknown safety profile that would interfere with participation or the results of this study.  Male subjects who are pregnant or nursing at the time of the index Ion procedure, as determined by standard site practices. Subjects that are incarcerated or institutionalized under court order, or other vulnerable populations.    Previous Clinical Trials Since receiving FDA clearance in Feb 2019, Ion has been adopted  commercially by over 226 centers in the [place], and utilized in over 40,000 procedures.  The first in-human study enrolled 31 subjects with a mean lesion size of 14.8 mm and the overall diagnostic yield was 79.3%, with no adverse events. 17 (58.6%) lesions were reported to have a bronchus sign available on CT imaging.  A multi-center study published results in 2022, with 270 lesions biopsied in 241 patients using Ion. The mean largest cardinal lesion size was 18.86.37mm, and the mean airway generation count was 7.01.6. Asymptomatic pneumothorax occurred in 3.3% of subjects, and 0.8% experienced airway bleeding.   Another study provided preliminary results in 2022, with 87% sensitivity for malignancy, a diagnostic yield of 81%, and a mean lesion size of 16 mm. 75% of biopsy cases were bronchus-sign negative. 4% of subjects experienced pneumothoraces (including those requiring intervention), and 0.8% of subjects experienced airway bleeding requiring wedging or balloon tamponade.  A single-center study captured 131 consecutive procedures of pulmonary biopsy using Ion. The navigational success rate was 98.7%, with an overall diagnostic yield of 81.7%, an overall complication rate of 3%, and a pneumothorax rate of 1.5%.    [facility] [place] Clinical Research Coordinator note:   This visit for Subject Frank Novak with DOB: 02-Sep-1945 on 07/03/2023 for the above protocol is Visit/Encounter # Pre-procedure, Intra-procedure and Post-procedure, and is for purpose of research.   The consent for this encounter is under:  Protocol Version 1.0 Investigator Brochure Version N/A Consent Version Revision A, dated 14Nov2023 and is currently IRB approved.   Cherylin Mylar expressed continued interest and consent in continuing as a study subject. Subject confirmed that there was no change in contact information (e.g. address, telephone, email). Subject thanked for participation in research and contribution to  science.  In this visit 07/03/2023 the subject will be evaluated by Principal Investigator named Dr. Tonia Brooms. This research coordinator has verified that the above investigator is up to date with his/her training logs.   The Subject was informed that the PI continues to have oversight of the subject's visits and course through relevant discussions, reviews, and also specifically of this visit by routing of this note to the PI.  The research study was discussed with the subject in the pre-operative room. The study was explained in detail including all the contents of the informed consent document. The subject was encouraged to ask questions. All questions were answered to their satisfaction. The IRB approved informed consent was signed, and a copy was given to the subject. After obtaining consent, the subject underwent scheduled procedure using the ion endoluminal system. Data collection was completed per protocol. Refer to paper source subject binder for further details.      Signed by  Verdene Lennert Clinical Research Coordinator / Sub-Investigator  [facility]  [place], [place] 9:37 AM 07/03/2023

## 2023-07-03 NOTE — Discharge Instructions (Signed)
Flexible Bronchoscopy, Care After This sheet gives you information about how to care for yourself after your test. Your doctor may also give you more specific instructions. If you have problems or questions, contact your doctor. Follow these instructions at home: Eating and drinking Do not eat or drink anything (not even water) for 2 hours after your test, or until your numbing medicine (local anesthetic) wears off. When your numbness is gone and your cough and gag reflexes have come back, you may: Eat only soft foods. Slowly drink liquids. The day after the test, go back to your normal diet. Driving Do not drive for 24 hours if you were given a medicine to help you relax (sedative). Do not drive or use heavy machinery while taking prescription pain medicine. General instructions  Take over-the-counter and prescription medicines only as told by your doctor. Return to your normal activities as told. Ask what activities are safe for you. Do not use any products that have nicotine or tobacco in them. This includes cigarettes and e-cigarettes. If you need help quitting, ask your doctor. Keep all follow-up visits as told by your doctor. This is important. It is very important if you had a tissue sample (biopsy) taken. Get help right away if: You have shortness of breath that gets worse. You get light-headed. You feel like you are going to pass out (faint). You have chest pain. You cough up: More than a little blood. More blood than before. Summary Do not eat or drink anything (not even water) for 2 hours after your test, or until your numbing medicine wears off. Do not use cigarettes. Do not use e-cigarettes. Get help right away if you have chest pain.  This information is not intended to replace advice given to you by your health care provider. Make sure you discuss any questions you have with your health care provider. Document Released: 06/04/2009 Document Revised: 07/20/2017 Document  Reviewed: 08/25/2016 Elsevier Patient Education  2020 Elsevier Inc.  

## 2023-07-03 NOTE — Transfer of Care (Signed)
Immediate Anesthesia Transfer of Care Note  Patient: JAILYNN MUFFLER  Procedure(s) Performed: ROBOTIC ASSISTED NAVIGATIONAL BRONCHOSCOPY (Right) BRONCHIAL BIOPSIES BRONCHIAL NEEDLE ASPIRATION BIOPSIES BRONCHIAL BRUSHINGS  Patient Location: PACU  Anesthesia Type:General  Level of Consciousness: awake  Airway & Oxygen Therapy: Patient Spontanous Breathing  Post-op Assessment: Report given to RN and Post -op Vital signs reviewed and stable  Post vital signs: Reviewed and stable  Last Vitals:  Vitals Value Taken Time  BP 129/94 07/03/23 0950  Temp 37.1 C 07/03/23 0950  Pulse 87 07/03/23 0955  Resp 20 07/03/23 0955  SpO2 87 % 07/03/23 0955  Vitals shown include unfiled device data.  Last Pain:  Vitals:   07/03/23 0950  TempSrc:   PainSc: 0-No pain         Complications: No notable events documented.

## 2023-07-03 NOTE — Anesthesia Procedure Notes (Signed)
Procedure Name: Intubation Date/Time: 07/03/2023 9:08 AM  Performed by: Loleta Emitt Maglione, CRNAPre-anesthesia Checklist: Patient identified, Patient being monitored, Timeout performed, Emergency Drugs available and Suction available Patient Re-evaluated:Patient Re-evaluated prior to induction Oxygen Delivery Method: Circle system utilized Preoxygenation: Pre-oxygenation with 100% oxygen Induction Type: IV induction Ventilation: Mask ventilation without difficulty Laryngoscope Size: 3 and Miller Grade View: Grade III Tube type: Oral Tube size: 8.5 mm Number of attempts: 2 (1st try with MAC 4 by CRNA. Only Epiglottis visualized) Airway Equipment and Method: Stylet Placement Confirmation: ETT inserted through vocal cords under direct vision, positive ETCO2 and breath sounds checked- equal and bilateral Secured at: 21 cm Tube secured with: Tape Dental Injury: Teeth and Oropharynx as per pre-operative assessment and Bloody posterior oropharynx  Difficulty Due To: Difficult Airway- due to anterior larynx and Difficult Airway- due to reduced neck mobility Comments: Intubation on 2nd attempt  by Dr Charlynn Grimes.

## 2023-07-03 NOTE — Anesthesia Postprocedure Evaluation (Signed)
Anesthesia Post Note  Patient: Frank Novak  Procedure(s) Performed: ROBOTIC ASSISTED NAVIGATIONAL BRONCHOSCOPY (Right) BRONCHIAL BIOPSIES BRONCHIAL NEEDLE ASPIRATION BIOPSIES BRONCHIAL BRUSHINGS     Patient location during evaluation: PACU Anesthesia Type: General Level of consciousness: awake and alert Pain management: pain level controlled Vital Signs Assessment: post-procedure vital signs reviewed and stable Respiratory status: spontaneous breathing, nonlabored ventilation, respiratory function stable and patient connected to nasal cannula oxygen Cardiovascular status: blood pressure returned to baseline and stable Postop Assessment: no apparent nausea or vomiting Anesthetic complications: no   No notable events documented.  Last Vitals:  Vitals:   07/03/23 1115 07/03/23 1130  BP: 131/85 138/63  Pulse: 61 66  Resp: 14 17  Temp:  37.1 C  SpO2: 94% 93%    Last Pain:  Vitals:   07/03/23 0950  TempSrc:   PainSc: 0-No pain                 [place]

## 2023-07-03 NOTE — Interval H&P Note (Signed)
History and Physical Interval Note:  07/03/2023 8:09 AM  Frank Novak  has presented today for surgery, with the diagnosis of RIGHT LOWER LUNG MASS.  The various methods of treatment have been discussed with the patient and family. After consideration of risks, benefits and other options for treatment, the patient has consented to  Procedure(s): ROBOTIC ASSISTED NAVIGATIONAL BRONCHOSCOPY (Right) as a surgical intervention.  The patient's history has been reviewed, patient examined, no change in status, stable for surgery.  I have reviewed the patient's chart and labs.  Questions were answered to the patient's satisfaction.     Rachel Bo [place] Watchman

## 2023-07-03 NOTE — Op Note (Signed)
Video Bronchoscopy with Robotic Assisted Bronchoscopic Navigation   Date of Operation: 07/03/2023   Pre-op Diagnosis: Lung nodule   Post-op Diagnosis: Lung nodule   Surgeon: Josephine Igo, DO   Assistants: None   Anesthesia: General endotracheal anesthesia  Operation: Flexible video fiberoptic bronchoscopy with robotic assistance and biopsies.  Estimated Blood Loss: Minimal  Complications: None  Indications and History: Frank Novak is a 77 y.o. male with history of lung nodule. The risks, benefits, complications, treatment options and expected outcomes were discussed with the patient.  The possibilities of pneumothorax, pneumonia, reaction to medication, pulmonary aspiration, perforation of a viscus, bleeding, failure to diagnose a condition and creating a complication requiring transfusion or operation were discussed with the patient who freely signed the consent.    Description of Procedure: The patient was seen in the Preoperative Area, was examined and was deemed appropriate to proceed.  The patient was taken to [facility] endoscopy room 3, identified as Frank Novak and the procedure verified as Flexible Video Fiberoptic Bronchoscopy.  A Time Out was held and the above information confirmed.   Prior to the date of the procedure a high-resolution CT scan of the chest was performed. Utilizing ION software program a virtual tracheobronchial tree was generated to allow the creation of distinct navigation pathways to the patient's parenchymal abnormalities. After being taken to the operating room general anesthesia was initiated and the patient  was orally intubated. The video fiberoptic bronchoscope was introduced via the endotracheal tube and a general inspection was performed which showed normal right and left lung anatomy, aspiration of the bilateral mainstems was completed to remove any remaining secretions. Robotic catheter inserted into patient's endotracheal tube.   Target #1 RLL: The  distinct navigation pathways prepared prior to this procedure were then utilized to navigate to patient's lesion identified on CT scan. The robotic catheter was secured into place and the vision probe was withdrawn.  Lesion location was approximated using fluoroscopy for peripheral targeting. Under fluoroscopic guidance transbronchial needle brushings, transbronchial needle biopsies, and transbronchial forceps biopsies were performed to be sent for cytology and pathology. .  At the end of the procedure a general airway inspection was performed and there was no evidence of active bleeding. The bronchoscope was removed.  The patient tolerated the procedure well. There was no significant blood loss and there were no obvious complications. A post-procedural chest x-ray is pending.  Samples Target #1: 1. Transbronchial needle brushings from RLL 2. Transbronchial Wang needle biopsies from RLL 3. Transbronchial forceps biopsies from RLL  Plans:  The patient will be discharged from the PACU to home when recovered from anesthesia and after chest x-ray is reviewed. We will review the cytology, pathology results with the patient when they become available. Outpatient followup will be with Josephine Igo, DO.  Josephine Igo, DO [facility] 07/03/2023 9:46 AM

## 2023-07-04 ENCOUNTER — Encounter (HOSPITAL_COMMUNITY): Payer: Self-pay | Admitting: Pulmonary Disease

## 2023-07-04 ENCOUNTER — Telehealth: Payer: Self-pay | Admitting: *Deleted

## 2023-07-04 MED FILL — Fentanyl Citrate Preservative Free (PF) Inj 100 MCG/2ML: INTRAMUSCULAR | Qty: 2 | Status: AC

## 2023-07-05 LAB — CYTOLOGY - NON PAP

## 2023-07-05 NOTE — Telephone Encounter (Signed)
Documented in error. Closing encounter

## 2023-07-06 ENCOUNTER — Encounter: Payer: Self-pay | Admitting: Adult Health

## 2023-07-06 ENCOUNTER — Telehealth: Payer: Medicare Other | Admitting: Adult Health

## 2023-07-06 DIAGNOSIS — R911 Solitary pulmonary nodule: Secondary | ICD-10-CM | POA: Diagnosis not present

## 2023-07-06 NOTE — Progress Notes (Unsigned)
Virtual Visit via Video Note  I connected with Frank Novak on 07/06/23 at  9:30 AM EST by a video enabled telemedicine application and verified that I am speaking with the correct person using two identifiers.  Location: Patient: Home  Provider: Office    I discussed the limitations of evaluation and management by telemedicine and the availability of in person appointments. The patient expressed understanding and agreed to proceed.  History of Present Illness: 77 year old male never smoker seen for pulmonary consult April 25, 2023 for lung mass. Medical history significant for melanoma status post resection (no chemo), history of DVT  Today's video visit is to discuss bronchoscopy results.  Patient was seen for an initial pulmonary consult April 24, 2021.  Patient had a CT chest abdomen and pelvis on February 19, 2023 that showed a right lower lobe lung mass.  PET scan was done on May 11, 2023 that showed a 7.1 x 3.5 cm right lower lobe lobulated mass lesion abuts and may involve the right major fissure.  Max SUV 2.8 considered non-FDG avid.  No hypermetabolic thoracic adenopathy.  No suspicious findings for metastatic disease.  CT chest on June 14, 2023 showed right lower lobe tubular branching mass which extends from the central aspect of the superior segment of the right lower lobe peripherally measuring 6.8 x 3.3.  This has a finger in glove configuration.  Appears to communicate with the lateral segment of the right lower lobe airway suggestive of a mucoid impaction/bronchocele.  Patient underwent navigational bronchoscopy on July 03, 2023.  Cytology for fine-needle aspiration and brushings showed rare atypical cells present and scant benign bronchial cells and blood.  We discussed his test results in detail.  Patient says since procedure he did very well.  Denies any hemoptysis.  Has no significant cough or congestion. Patient says he remains very active.  He is a caregiver for  his wife.  He is retired from the [employer].  Has had no unintentional weight loss.  Has no known occupational exposures.  Was in the banking industry.  Has no previous chemotherapy, medication exposure such as methotrexate or amiodarone.  Patient says approximately 2 years ago he had a severe bronchitis that took several months to resolve and he did take inhalers briefly but was able to finally get rid of his cough.  He did use some homeopathic medications that seem to help.  He is not currently taking any respiratory medications.  Past Medical History:  Diagnosis Date   BPH (benign prostatic hyperplasia)    Colon polyp    Complication of anesthesia    Diverticulosis    DVT (deep venous thrombosis) (HCC) 2005   LLE   Hyperlipidemia    Hypertension    Pneumonia    Skin cancer    Urinary tract infection     Current Outpatient Medications on File Prior to Visit  Medication Sig Dispense Refill   hydrochlorothiazide (HYDRODIURIL) 12.5 MG tablet Take 12.5 mg by mouth daily.     naproxen (NAPROSYN) 500 MG tablet Take 500 mg by mouth 2 (two) times daily with a meal.     tamsulosin (FLOMAX) 0.4 MG CAPS capsule Take 1 capsule by mouth as needed.     No current facility-administered medications on file prior to visit.        Observations/Objective: Appears well in no acute distress  Assessment and Plan: Large right lower lobe lung mass-CT chest abdomen and pelvis on February 19, 2023 that showed a right  lower lobe lung mass.  PET scan was done on May 11, 2023 that showed a 7.1 x 3.5 cm right lower lobe lobulated mass lesion abuts and may involve the right major fissure.  Max SUV 2.8 considered non-FDG avid.  No hypermetabolic thoracic adenopathy.  No suspicious findings for metastatic disease.  CT chest on June 14, 2023 showed right lower lobe tubular branching mass which extends from the central aspect of the superior segment of the right lower lobe peripherally measuring 6.8 x 3.3.   This has a finger in glove configuration.  Appears to communicate with the lateral segment of the right lower lobe airway suggestive of a mucoid impaction/bronchocele.  Patient underwent navigational bronchoscopy on July 03, 2023.  Cytology for fine-needle aspiration and brushings showed rare atypical cells present and scant benign bronchial cells and blood.- Will discuss in detail with Dr. Tonia Brooms -as results are nondiagnostic we will decide on next step whether we will attempt additional tissue sampling versus serial follow-up with CT chest in 3 months.  Patient is essentially asymptomatic.  Plan -  Case discussed in detail with Dr. Tonia Brooms  CT chest in 3 months. (Patient aware ) Follow up with Dr. Tonia Brooms in 3 months.     Follow Up Instructions:    I discussed the assessment and treatment plan with the patient. The patient was provided an opportunity to ask questions and all were answered. The patient agreed with the plan and demonstrated an understanding of the instructions.   The patient was advised to call back or seek an in-person evaluation if the symptoms worsen or if the condition fails to improve as anticipated.  I provided 31  minutes of non-face-to-face time during this encounter.   Rubye Oaks, NP

## 2023-07-06 NOTE — Patient Instructions (Signed)
Will discuss case with Dr. Tonia Brooms and get back to you with neck step.

## 2023-07-09 ENCOUNTER — Telehealth: Payer: Self-pay | Admitting: Pulmonary Disease

## 2023-07-09 NOTE — Telephone Encounter (Signed)
Left voicemail for patient to call back to schedule 3 month follow up with Dr. Tonia Brooms for lung mass

## 2023-07-10 ENCOUNTER — Telehealth: Payer: Medicare Other | Admitting: Acute Care

## 2023-07-12 NOTE — Progress Notes (Signed)
Reviewed at last office visit.   Thanks,  BLI  Josephine Igo, DO [facility] 07/12/2023 8:16 AM

## 2023-08-06 DIAGNOSIS — H43813 Vitreous degeneration, bilateral: Secondary | ICD-10-CM | POA: Diagnosis not present

## 2023-08-06 DIAGNOSIS — H2513 Age-related nuclear cataract, bilateral: Secondary | ICD-10-CM | POA: Diagnosis not present

## 2023-10-01 DIAGNOSIS — H2513 Age-related nuclear cataract, bilateral: Secondary | ICD-10-CM | POA: Diagnosis not present

## 2023-10-01 DIAGNOSIS — H5213 Myopia, bilateral: Secondary | ICD-10-CM | POA: Diagnosis not present

## 2023-10-03 ENCOUNTER — Telehealth: Payer: Self-pay | Admitting: Primary Care

## 2023-10-03 NOTE — Telephone Encounter (Signed)
I had to leave another message asking the patient to call me back to schedule his CT

## 2023-10-03 NOTE — Telephone Encounter (Signed)
PT ret your call again. Please try again. TY. (Security is blocking all calls even though he has put you in his phone.) He can use MYCHART.

## 2023-10-03 NOTE — Telephone Encounter (Signed)
PT is ret your call. Please call back @ [phone]

## 2023-10-04 NOTE — Telephone Encounter (Signed)
I have finally spoke with Frank Novak and his CT has been scheduled on 11/26/23 @ 10:30am at [facility] Entrance

## 2023-10-18 ENCOUNTER — Telehealth: Payer: Self-pay | Admitting: Internal Medicine

## 2023-10-18 NOTE — Telephone Encounter (Signed)
 [facility] [place] [facility] Coordinator note:   This visit for Subject Frank Novak with DOB: Nov 26, 1945 on 10/18/2023. Subject contacted regarding [id number] to inform that Principal Investigator has changed to Dr. Delton Coombes. Voicemail left requesting a return call.

## 2023-10-29 ENCOUNTER — Ambulatory Visit: Payer: Medicare Other | Admitting: Pulmonary Disease

## 2023-11-01 DIAGNOSIS — I10 Essential (primary) hypertension: Secondary | ICD-10-CM | POA: Diagnosis not present

## 2023-11-06 DIAGNOSIS — M170 Bilateral primary osteoarthritis of knee: Secondary | ICD-10-CM | POA: Diagnosis not present

## 2023-11-06 DIAGNOSIS — N400 Enlarged prostate without lower urinary tract symptoms: Secondary | ICD-10-CM | POA: Diagnosis not present

## 2023-11-06 DIAGNOSIS — R918 Other nonspecific abnormal finding of lung field: Secondary | ICD-10-CM | POA: Diagnosis not present

## 2023-11-06 DIAGNOSIS — I10 Essential (primary) hypertension: Secondary | ICD-10-CM | POA: Diagnosis not present

## 2023-11-06 DIAGNOSIS — Z79899 Other long term (current) drug therapy: Secondary | ICD-10-CM | POA: Diagnosis not present

## 2023-11-06 DIAGNOSIS — E7800 Pure hypercholesterolemia, unspecified: Secondary | ICD-10-CM | POA: Diagnosis not present

## 2023-11-19 DIAGNOSIS — M170 Bilateral primary osteoarthritis of knee: Secondary | ICD-10-CM | POA: Diagnosis not present

## 2023-11-19 DIAGNOSIS — E7800 Pure hypercholesterolemia, unspecified: Secondary | ICD-10-CM | POA: Diagnosis not present

## 2023-11-19 DIAGNOSIS — I10 Essential (primary) hypertension: Secondary | ICD-10-CM | POA: Diagnosis not present

## 2023-11-26 ENCOUNTER — Ambulatory Visit
Admission: RE | Admit: 2023-11-26 | Discharge: 2023-11-26 | Disposition: A | Discharge status: Home / Self Care | Payer: Medicare Other | Source: Ambulatory Visit | Attending: Adult Health | Admitting: Adult Health

## 2023-11-26 DIAGNOSIS — R911 Solitary pulmonary nodule: Secondary | ICD-10-CM | POA: Insufficient documentation

## 2023-11-26 DIAGNOSIS — C439 Malignant melanoma of skin, unspecified: Secondary | ICD-10-CM | POA: Diagnosis not present

## 2023-11-26 DIAGNOSIS — R918 Other nonspecific abnormal finding of lung field: Secondary | ICD-10-CM | POA: Diagnosis not present

## 2023-12-05 DIAGNOSIS — I10 Essential (primary) hypertension: Secondary | ICD-10-CM | POA: Diagnosis not present

## 2023-12-19 ENCOUNTER — Encounter: Payer: Self-pay | Admitting: Acute Care

## 2023-12-19 ENCOUNTER — Ambulatory Visit: Payer: Medicare Other | Admitting: Acute Care

## 2023-12-19 VITALS — BP 153/86 | HR 56 | Ht 69.0 in | Wt 196.2 lb

## 2023-12-19 DIAGNOSIS — Z9889 Other specified postprocedural states: Secondary | ICD-10-CM

## 2023-12-19 DIAGNOSIS — I10 Essential (primary) hypertension: Secondary | ICD-10-CM | POA: Diagnosis not present

## 2023-12-19 DIAGNOSIS — M170 Bilateral primary osteoarthritis of knee: Secondary | ICD-10-CM | POA: Diagnosis not present

## 2023-12-19 DIAGNOSIS — R911 Solitary pulmonary nodule: Secondary | ICD-10-CM

## 2023-12-19 DIAGNOSIS — E7800 Pure hypercholesterolemia, unspecified: Secondary | ICD-10-CM | POA: Diagnosis not present

## 2023-12-19 NOTE — Patient Instructions (Addendum)
 It is good to see you today. Your CT chest shows a stable lung nodule. We will do a 6 month follow up CT Chest to continue to monitor his nodule. This will be due 05/2024. You will get a call closer to the time to get this scheduled.  Please call us  if you need us  sooner. If you develop any blood in your sputum, or have weight loss that you cannot explain, please call to be seen sooner. Please contact office for sooner follow up if symptoms do not improve or worsen or seek emergency care

## 2023-12-19 NOTE — Progress Notes (Signed)
 History of Present Illness Frank Novak is a 78 y.o. male  never smoker seen for pulmonary consult April 25, 2023 for lung mass. Medical history significant for melanoma status post resection (no chemo), history of DVT.  Synopsis  Patient was seen for an initial pulmonary consult April 24, 2021.  Patient had a CT chest abdomen and pelvis on February 19, 2023 that showed a right lower lobe lung mass.  PET scan was done on May 11, 2023 that showed a 7.1 x 3.5 cm right lower lobe lobulated mass lesion abuts and may involve the right major fissure.  Max SUV 2.8 considered non-FDG avid.  No hypermetabolic thoracic adenopathy.  No suspicious findings for metastatic disease.  CT chest on June 14, 2023 showed right lower lobe tubular branching mass which extends from the central aspect of the superior segment of the right lower lobe peripherally measuring 6.8 x 3.3.  This has a finger in glove configuration.  Appears to communicate with the lateral segment of the right lower lobe airway suggestive of a mucoid impaction/bronchocele.  Patient underwent navigational bronchoscopy on July 03, 2023.  Cytology for fine-needle aspiration and brushings showed rare atypical cells present and scant benign bronchial cells and blood.  We discussed his test results in detail.  Patient says since procedure he did very well.  Denies any hemoptysis.  Has no significant cough or congestion. Patient says he remains very active.  He is a caregiver for his wife.  He is retired from the [employer].  Has had no unintentional weight loss.  Has no known occupational exposures.  Was in the banking industry.  Has no previous chemotherapy, medication exposure such as methotrexate or amiodarone.  Patient says approximately 2 years ago he had a severe bronchitis that took several months to resolve and he did take inhalers briefly but was able to finally get rid of his cough.  He did use some homeopathic medications that  seem to help.  He is not currently taking any respiratory medications. After bronchoscopy  with biopsies11/07/2023 , which were negative for malignancy , plan was for a 3 month follow up Ct Chest. This was done 11/2023. Patient is here today  to review the results.     12/19/2023 Pt.presents for folow up after CT Chest. He states he has been doing well. He is active and works in his yard. He has no respiratory complaints.He denies weight loss.No hemoptysis. He does endorse some weight gain. He is still caring for his wife who has some memory issues and has recently broken her hip.   We have reviewed his CT Chest. Per radiology the scan remains stable in regard to the right lower lobe mass. We discussed continued surveillance with CT's and he is in agreement with that plan. We will plan on a 6 month follow up, and if stable at 6 months we can spread out to annually until stable for 2-3 years.   Test Results: CT Chest 11/26/2023, read 12/16/2023 Stable appearance of tubular and branching mass within the right lower lobe which has a finger in glove configuration and appears to communicate with lateral segment of right lower lobe airway. Imaging findings are favored to represent mucoid impaction/bronchocele. Continued interval surveillance is advised. Recommend repeat CT of the chest in 12 months. Coronary artery calcifications. Aortic Atherosclerosis     Latest Ref Rng & Units 07/03/2023    8:34 AM  CBC  Hemoglobin 13.0 - 17.0 g/dL 09.8   Hematocrit 11.9 -  52.0 % 42.0        Latest Ref Rng & Units 07/03/2023    8:34 AM  BMP  Glucose 70 - 99 mg/dL 098   BUN 8 - 23 mg/dL 21   Creatinine 1.19 - 1.24 mg/dL 1.47   Sodium 829 - 562 mmol/L 140   Potassium 3.5 - 5.1 mmol/L 4.1   Chloride 98 - 111 mmol/L 101     BNP No results found for: "BNP"  ProBNP No results found for: "PROBNP"  PFT No results found for: "FEV1PRE", "FEV1POST", "FVCPRE", "FVCPOST", "TLC", "DLCOUNC", "PREFEV1FVCRT",  "PSTFEV1FVCRT"  CT Chest Wo Contrast Result Date: 12/16/2023 CLINICAL DATA:  History of melanoma. Follow-up lung mass. History of melanoma. EXAM: CT CHEST WITHOUT CONTRAST TECHNIQUE: Multidetector CT imaging of the chest was performed following the standard protocol without IV contrast. RADIATION DOSE REDUCTION: This exam was performed according to the departmental dose-optimization program which includes automated exposure control, adjustment of the mA and/or kV according to patient size and/or use of iterative reconstruction technique. COMPARISON:  06/14/2023 FINDINGS: Cardiovascular: Heart size is upper limits of normal. Aortic atherosclerosis. Coronary artery calcifications. No pericardial effusion. Mediastinum/Nodes: Thyroid  gland, trachea, and esophagus appear normal. No enlarged axillary, mediastinal, or hilar lymph nodes. Lungs/Pleura: Central airways appear patent. No pleural effusion or airspace consolidation. Tubular and branching mass within the right lower lobe is again identified. This measures 6.7 by 3.2 by 3.2 cm, image 83/4 and image 99/5. This has a finger in glove configuration and appears to communicate with lateral segment of right lower lobe airway, image 97/5. The Hounsfield units measured within this mass range between 5.7 and 15.6. No new or enlarging lung nodules. Bilateral calcified granulomas. Upper Abdomen: Left lobe of liver cyst measures 2.3 cm. No acute abnormality. Musculoskeletal: No chest wall mass or suspicious bone lesions identified. IMPRESSION: 1. Stable appearance of tubular and branching mass within the right lower lobe which has a finger in glove configuration and appears to communicate with lateral segment of right lower lobe airway. Imaging findings are favored to represent mucoid impaction/bronchocele. Continued interval surveillance is advised. Recommend repeat CT of the chest in 12 months. 2. Coronary artery calcifications. 3.  Aortic Atherosclerosis (ICD10-I70.0).  Electronically Signed   By: Kimberley Penman M.D.   On: 12/16/2023 04:41     Past medical hx Past Medical History:  Diagnosis Date   BPH (benign prostatic hyperplasia)    Colon polyp    Complication of anesthesia    Diverticulosis    DVT (deep venous thrombosis) (HCC) 2005   LLE   Hyperlipidemia    Hypertension    Pneumonia    Skin cancer    Urinary tract infection      Social History   Tobacco Use   Smoking status: Never    Passive exposure: Past   Smokeless tobacco: Never  Vaping Use   Vaping status: Never Used  Substance Use Topics   Alcohol use: Not Currently   Drug use: Never    Mr.Suddeth reports that he has never smoked. He has been exposed to tobacco smoke. He has never used smokeless tobacco. He reports that he does not currently use alcohol. He reports that he does not use drugs.  Tobacco Cessation: Counseling given: Not Answered Never smoker  Past surgical hx, Family hx, Social hx all reviewed.  Current Outpatient Medications on File Prior to Visit  Medication Sig   hydrochlorothiazide (HYDRODIURIL) 12.5 MG tablet Take 12.5 mg by mouth daily.   naproxen (NAPROSYN)  500 MG tablet Take 500 mg by mouth 2 (two) times daily with a meal.   tamsulosin (FLOMAX) 0.4 MG CAPS capsule Take 1 capsule by mouth as needed.   No current facility-administered medications on file prior to visit.     Allergies  Allergen Reactions   Penicillins     Child- unsure of reaction    Tolmetin    Propofol  Rash   Sulfadiazine Rash   Tetracyclines & Related Rash    Review Of Systems:  Constitutional:   No  weight loss, night sweats,  Fevers, chills, fatigue, or  lassitude.  HEENT:   No headaches,  Difficulty swallowing,  Tooth/dental problems, or  Sore throat,                No sneezing, itching, ear ache, nasal congestion, post nasal drip,   CV:  No chest pain,  Orthopnea, PND, swelling in lower extremities, anasarca, dizziness, palpitations, syncope.   GI  No heartburn,  indigestion, abdominal pain, nausea, vomiting, diarrhea, change in bowel habits, loss of appetite, bloody stools.   Resp: No shortness of breath with exertion or at rest.  No excess mucus, no productive cough,  No non-productive cough,  No coughing up of blood.  No change in color of mucus.  No wheezing.  No chest wall deformity  Skin: no rash or lesions.  GU: no dysuria, change in color of urine, no urgency or frequency.  No flank pain, no hematuria   MS:  No joint pain or swelling.  No decreased range of motion.  No back pain.  Psych:  No change in mood or affect. No depression or anxiety.  No memory loss.   Vital Signs BP (!) 153/86 (BP Location: Left Arm, Patient Position: Sitting, Cuff Size: Normal)   Pulse (!) 56   Ht 5\' 9"  (1.753 m)   Wt 196 lb 3.2 oz (89 kg)   SpO2 94%   BMI 28.97 kg/m    Physical Exam:  General- No distress,  A&Ox3, pleasant ENT: No sinus tenderness, TM clear, pale nasal mucosa, no oral exudate,no post nasal drip, no LAN Cardiac: S1, S2, regular rate and rhythm, no murmur Chest: No wheeze/ rales/ dullness; no accessory muscle use, no nasal flaring, no sternal retractions Abd.: Soft Non-tender, ND, BS +, Body mass index is 28.97 kg/m.  Ext: No clubbing cyanosis, edema, no obvious deformities Neuro:  normal strength, MAE x 4, A&O x 3 Skin: No rashes, warm and dry, no obvious lesions  Psych: normal mood and behavior   Assessment/Plan Stable right lower lobe pulmonary nodule Previous biopsy 06/2023 showed atypical cells Never smoker  Plan Your CT chest shows a stable lung nodule. We will do a 6 month follow up CT Chest to continue to monitor his nodule. This will be due 05/2024. You will get a call closer to the time to get this scheduled.  Please call us  if you need us  sooner. If you develop any blood in your sputum, or have weight loss that you cannot explain, please call to be seen sooner. Please contact office for sooner follow up if symptoms  do not improve or worsen or seek emergency care    I spent 20 minutes dedicated to the care of this patient on the date of this encounter to include pre-visit review of records, face-to-face time with the patient discussing conditions above, post visit ordering of testing, clinical documentation with the electronic health record, making appropriate referrals as documented, and communicating necessary information  to the patient's healthcare team.    Raejean Bullock, NP 12/19/2023  10:38 AM

## 2023-12-28 DIAGNOSIS — G8929 Other chronic pain: Secondary | ICD-10-CM | POA: Diagnosis not present

## 2023-12-28 DIAGNOSIS — M170 Bilateral primary osteoarthritis of knee: Secondary | ICD-10-CM | POA: Diagnosis not present

## 2024-01-04 DIAGNOSIS — M25561 Pain in right knee: Secondary | ICD-10-CM | POA: Diagnosis not present

## 2024-01-08 DIAGNOSIS — I10 Essential (primary) hypertension: Secondary | ICD-10-CM | POA: Diagnosis not present

## 2024-01-19 DIAGNOSIS — M170 Bilateral primary osteoarthritis of knee: Secondary | ICD-10-CM | POA: Diagnosis not present

## 2024-01-19 DIAGNOSIS — I10 Essential (primary) hypertension: Secondary | ICD-10-CM | POA: Diagnosis not present

## 2024-01-19 DIAGNOSIS — E7800 Pure hypercholesterolemia, unspecified: Secondary | ICD-10-CM | POA: Diagnosis not present

## 2024-02-18 DIAGNOSIS — M170 Bilateral primary osteoarthritis of knee: Secondary | ICD-10-CM | POA: Diagnosis not present

## 2024-02-18 DIAGNOSIS — E7800 Pure hypercholesterolemia, unspecified: Secondary | ICD-10-CM | POA: Diagnosis not present

## 2024-02-18 DIAGNOSIS — I10 Essential (primary) hypertension: Secondary | ICD-10-CM | POA: Diagnosis not present

## 2024-02-19 DIAGNOSIS — B349 Viral infection, unspecified: Secondary | ICD-10-CM | POA: Diagnosis not present

## 2024-02-19 DIAGNOSIS — R509 Fever, unspecified: Secondary | ICD-10-CM | POA: Diagnosis not present

## 2024-02-19 DIAGNOSIS — Z03818 Encounter for observation for suspected exposure to other biological agents ruled out: Secondary | ICD-10-CM | POA: Diagnosis not present

## 2024-03-20 DIAGNOSIS — E7800 Pure hypercholesterolemia, unspecified: Secondary | ICD-10-CM | POA: Diagnosis not present

## 2024-03-20 DIAGNOSIS — M170 Bilateral primary osteoarthritis of knee: Secondary | ICD-10-CM | POA: Diagnosis not present

## 2024-03-20 DIAGNOSIS — I10 Essential (primary) hypertension: Secondary | ICD-10-CM | POA: Diagnosis not present

## 2024-04-20 DIAGNOSIS — E7800 Pure hypercholesterolemia, unspecified: Secondary | ICD-10-CM | POA: Diagnosis not present

## 2024-04-20 DIAGNOSIS — M170 Bilateral primary osteoarthritis of knee: Secondary | ICD-10-CM | POA: Diagnosis not present

## 2024-04-20 DIAGNOSIS — I10 Essential (primary) hypertension: Secondary | ICD-10-CM | POA: Diagnosis not present

## 2024-04-28 DIAGNOSIS — M170 Bilateral primary osteoarthritis of knee: Secondary | ICD-10-CM | POA: Diagnosis not present

## 2024-05-01 DIAGNOSIS — Z85828 Personal history of other malignant neoplasm of skin: Secondary | ICD-10-CM | POA: Diagnosis not present

## 2024-05-01 DIAGNOSIS — L738 Other specified follicular disorders: Secondary | ICD-10-CM | POA: Diagnosis not present

## 2024-05-01 DIAGNOSIS — D2272 Melanocytic nevi of left lower limb, including hip: Secondary | ICD-10-CM | POA: Diagnosis not present

## 2024-05-01 DIAGNOSIS — D2271 Melanocytic nevi of right lower limb, including hip: Secondary | ICD-10-CM | POA: Diagnosis not present

## 2024-05-01 DIAGNOSIS — D2262 Melanocytic nevi of left upper limb, including shoulder: Secondary | ICD-10-CM | POA: Diagnosis not present

## 2024-05-01 DIAGNOSIS — D2261 Melanocytic nevi of right upper limb, including shoulder: Secondary | ICD-10-CM | POA: Diagnosis not present

## 2024-05-01 DIAGNOSIS — D225 Melanocytic nevi of trunk: Secondary | ICD-10-CM | POA: Diagnosis not present

## 2024-05-01 DIAGNOSIS — L570 Actinic keratosis: Secondary | ICD-10-CM | POA: Diagnosis not present

## 2024-05-12 DIAGNOSIS — Z0000 Encounter for general adult medical examination without abnormal findings: Secondary | ICD-10-CM | POA: Diagnosis not present

## 2024-05-12 DIAGNOSIS — Z1331 Encounter for screening for depression: Secondary | ICD-10-CM | POA: Diagnosis not present

## 2024-05-12 DIAGNOSIS — M170 Bilateral primary osteoarthritis of knee: Secondary | ICD-10-CM | POA: Diagnosis not present

## 2024-05-12 DIAGNOSIS — K4090 Unilateral inguinal hernia, without obstruction or gangrene, not specified as recurrent: Secondary | ICD-10-CM | POA: Diagnosis not present

## 2024-05-12 DIAGNOSIS — Z79899 Other long term (current) drug therapy: Secondary | ICD-10-CM | POA: Diagnosis not present

## 2024-05-12 DIAGNOSIS — N400 Enlarged prostate without lower urinary tract symptoms: Secondary | ICD-10-CM | POA: Diagnosis not present

## 2024-05-12 DIAGNOSIS — R918 Other nonspecific abnormal finding of lung field: Secondary | ICD-10-CM | POA: Diagnosis not present

## 2024-05-12 DIAGNOSIS — E7800 Pure hypercholesterolemia, unspecified: Secondary | ICD-10-CM | POA: Diagnosis not present

## 2024-05-12 DIAGNOSIS — I10 Essential (primary) hypertension: Secondary | ICD-10-CM | POA: Diagnosis not present

## 2024-05-20 DIAGNOSIS — M170 Bilateral primary osteoarthritis of knee: Secondary | ICD-10-CM | POA: Diagnosis not present

## 2024-05-20 DIAGNOSIS — E7800 Pure hypercholesterolemia, unspecified: Secondary | ICD-10-CM | POA: Diagnosis not present

## 2024-05-20 DIAGNOSIS — I10 Essential (primary) hypertension: Secondary | ICD-10-CM | POA: Diagnosis not present

## 2024-06-02 DIAGNOSIS — I10 Essential (primary) hypertension: Secondary | ICD-10-CM | POA: Diagnosis not present

## 2024-06-19 ENCOUNTER — Ambulatory Visit
Admission: RE | Admit: 2024-06-19 | Discharge: 2024-06-19 | Disposition: A | Discharge status: Home / Self Care | Source: Ambulatory Visit | Attending: Acute Care | Admitting: Acute Care

## 2024-06-19 DIAGNOSIS — R911 Solitary pulmonary nodule: Secondary | ICD-10-CM | POA: Insufficient documentation

## 2024-06-19 DIAGNOSIS — I700 Atherosclerosis of aorta: Secondary | ICD-10-CM | POA: Diagnosis not present

## 2024-06-19 DIAGNOSIS — R918 Other nonspecific abnormal finding of lung field: Secondary | ICD-10-CM | POA: Diagnosis not present

## 2024-06-20 DIAGNOSIS — I10 Essential (primary) hypertension: Secondary | ICD-10-CM | POA: Diagnosis not present

## 2024-06-20 DIAGNOSIS — M170 Bilateral primary osteoarthritis of knee: Secondary | ICD-10-CM | POA: Diagnosis not present

## 2024-06-20 DIAGNOSIS — E7800 Pure hypercholesterolemia, unspecified: Secondary | ICD-10-CM | POA: Diagnosis not present

## 2024-06-25 ENCOUNTER — Encounter: Payer: Self-pay | Admitting: Acute Care

## 2024-06-25 ENCOUNTER — Ambulatory Visit: Admitting: Acute Care

## 2024-06-25 VITALS — BP 161/74 | HR 57 | Temp 98.1°F | Ht 69.5 in | Wt 196.8 lb

## 2024-06-25 DIAGNOSIS — R9389 Abnormal findings on diagnostic imaging of other specified body structures: Secondary | ICD-10-CM

## 2024-06-25 DIAGNOSIS — R911 Solitary pulmonary nodule: Secondary | ICD-10-CM | POA: Diagnosis not present

## 2024-06-25 NOTE — Progress Notes (Signed)
 History of Present Illness Frank Novak is a 78 y.o. male never smoker seen for pulmonary consult April 25, 2023 for lung mass. Medical history significant for melanoma status post resection (no chemo), history of DVT.  Synopsis Patient was seen for an initial pulmonary consult April 24, 2021.  Patient had a CT chest abdomen and pelvis on February 19, 2023 that showed a right lower lobe lung mass.  PET scan was done on May 11, 2023 that showed a 7.1 x 3.5 cm right lower lobe lobulated mass lesion abuts and may involve the right major fissure.  Max SUV 2.8 considered non-FDG avid.  No hypermetabolic thoracic adenopathy.  No suspicious findings for metastatic disease.   CT chest on June 14, 2023 showed right lower lobe tubular branching mass which extends from the central aspect of the superior segment of the right lower lobe peripherally measuring 6.8 x 3.3.  This has a finger in glove configuration.  Appears to communicate with the lateral segment of the right lower lobe airway suggestive of a mucoid impaction/bronchocele.  Patient underwent navigational bronchoscopy on July 03, 2023.  Cytology for fine-needle aspiration and brushings showed rare atypical cells present and scant benign bronchial cells and blood.  We discussed his test results in detail.  Patient says since procedure he did very well.  Denies any hemoptysis.  Has no significant cough or congestion. Patient says he remains very active.  He is a caregiver for his wife.  He is retired from the scientific laboratory technician.  Has had no unintentional weight loss.  Has no known occupational exposures.  Was in the banking industry.  Has no previous chemotherapy, medication exposure such as methotrexate or amiodarone.  Patient says approximately 2 years ago he had a severe bronchitis that took several months to resolve and he did take inhalers briefly but was able to finally get rid of his cough.  He did use some homeopathic medications that  seem to help.  He is not currently taking any respiratory medications. After bronchoscopy  with biopsies11/07/2023 , which were negative for malignancy , plan was for a 3 month follow up Ct Chest. This was done 11/26/2023, and was stable at that time. Plan after that scan was for a 6 month follow up scan. This was done 06/19/2024. Pt. Is here today to review results.   06/25/2024 Discussed the use of AI scribe software for clinical note transcription with the patient, who gave verbal consent to proceed.  History of Present Illness Pt. Presents for follow up to review surveillance CT Chest imaging.  He states he has been doing well.  No changes in his pulmonary condition since he was last seen in April 2025.  We have reviewed the CT of his chest.  It is read as stable compared to previous imaging ,with no abnormal lymph nodes.  Plan will be for a 41-month follow-up CT chest to maintain surveillance.  We will do a video visit 1 to 2 weeks after the scan to review the results.  I have asked the patient to call to be seen her for any unexplained weight loss or blood in his sputum.  He verbalized understanding and was in agreement.     Test Results: CT Chest 06/19/2024 Mediastinum/Nodes: No enlarged mediastinal, hilar, or axillary lymph nodes. Thyroid  gland, trachea, and esophagus demonstrate no significant findings.   Lungs/Pleura: Unchanged, elongated, branching, masslike lesion in the peripheral right lower lobe, measuring 6.0 x 3.5 x 2.7 cm (series 4, image  88, series 5, image 75). This again appears to arise from the occluded lateral segment right lower lobe airway (series 4, image 71) and demonstrates internal fluid attenuation with some suggestion of peripheral calcification. No pleural effusion or pneumothorax. Unchanged, elongated, branching, masslike lesion in the peripheral right lower lobe, measuring 6.0 x 3.5 x 2.7 cm. This again appears to arise from the occluded lateral segment  right lower lobe airway and demonstrates internal fluid attenuation with some suggestion of peripheral calcification. Appearance is again strongly suggestive of a large bronchiolar impaction, the presence of an obstructing endobronchial mass not excluded on the basis of this imaging. Correlate with bronchoscopic findings. 2. No evidence of lymphadenopathy or metastatic disease in the chest. 3. Coronary artery disease. 4. Aortic valve calcifications. Correlate for echocardiographic evidence of aortic valve dysfunction.        Latest Ref Rng & Units 07/03/2023    8:34 AM  CBC  Hemoglobin 13.0 - 17.0 g/dL 85.6   Hematocrit 60.9 - 52.0 % 42.0        Latest Ref Rng & Units 07/03/2023    8:34 AM  BMP  Glucose 70 - 99 mg/dL 893   BUN 8 - 23 mg/dL 21   Creatinine 9.38 - 1.24 mg/dL 9.09   Sodium 864 - 854 mmol/L 140   Potassium 3.5 - 5.1 mmol/L 4.1   Chloride 98 - 111 mmol/L 101     BNP No results found for: BNP  ProBNP No results found for: PROBNP  PFT No results found for: FEV1PRE, FEV1POST, FVCPRE, FVCPOST, TLC, DLCOUNC, PREFEV1FVCRT, PSTFEV1FVCRT  CT CHEST WO CONTRAST Result Date: 06/22/2024 CLINICAL DATA:  Follow-up lung mass, history of melanoma * Tracking Code: BO * EXAM: CT CHEST WITHOUT CONTRAST TECHNIQUE: Multidetector CT imaging of the chest was performed following the standard protocol without IV contrast. RADIATION DOSE REDUCTION: This exam was performed according to the departmental dose-optimization program which includes automated exposure control, adjustment of the mA and/or kV according to patient size and/or use of iterative reconstruction technique. COMPARISON:  11/26/2023 FINDINGS: Cardiovascular: Scattered aortic atherosclerosis. Aortic valve calcifications. Normal heart size. Three-vessel coronary artery calcifications. No pericardial effusion. Mediastinum/Nodes: No enlarged mediastinal, hilar, or axillary lymph nodes. Thyroid  gland,  trachea, and esophagus demonstrate no significant findings. Lungs/Pleura: Unchanged, elongated, branching, masslike lesion in the peripheral right lower lobe, measuring 6.0 x 3.5 x 2.7 cm (series 4, image 88, series 5, image 75). This again appears to arise from the occluded lateral segment right lower lobe airway (series 4, image 71) and demonstrates internal fluid attenuation with some suggestion of peripheral calcification. No pleural effusion or pneumothorax. Upper Abdomen: No acute abnormality. Musculoskeletal: No chest wall abnormality. No acute osseous findings. IMPRESSION: 1. Unchanged, elongated, branching, masslike lesion in the peripheral right lower lobe, measuring 6.0 x 3.5 x 2.7 cm. This again appears to arise from the occluded lateral segment right lower lobe airway and demonstrates internal fluid attenuation with some suggestion of peripheral calcification. Appearance is again strongly suggestive of a large bronchiolar impaction, the presence of an obstructing endobronchial mass not excluded on the basis of this imaging. Correlate with bronchoscopic findings. 2. No evidence of lymphadenopathy or metastatic disease in the chest. 3. Coronary artery disease. 4. Aortic valve calcifications. Correlate for echocardiographic evidence of aortic valve dysfunction. Aortic Atherosclerosis (ICD10-I70.0). Electronically Signed   By: Marolyn JONETTA Jaksch M.D.   On: 06/22/2024 16:43     Past medical hx Past Medical History:  Diagnosis Date   BPH (benign prostatic  hyperplasia)    Colon polyp    Complication of anesthesia    Diverticulosis    DVT (deep venous thrombosis) (HCC) 2005   LLE   Hyperlipidemia    Hypertension    Pneumonia    Skin cancer    Urinary tract infection      Social History   Tobacco Use   Smoking status: Never    Passive exposure: Past   Smokeless tobacco: Never  Vaping Use   Vaping status: Never Used  Substance Use Topics   Alcohol use: Not Currently   Drug use: Never     Mr.Eisenmenger reports that he has never smoked. He has been exposed to tobacco smoke. He has never used smokeless tobacco. He reports that he does not currently use alcohol. He reports that he does not use drugs.  Tobacco Cessation: Counseling given: Not Answered Patient is a never smoker  Past surgical hx, Family hx, Social hx all reviewed.  Current Outpatient Medications on File Prior to Visit  Medication Sig   hydrochlorothiazide (HYDRODIURIL) 12.5 MG tablet Take 12.5 mg by mouth daily.   naproxen (NAPROSYN) 500 MG tablet Take 500 mg by mouth 2 (two) times daily with a meal.   tamsulosin (FLOMAX) 0.4 MG CAPS capsule Take 1 capsule by mouth as needed.   No current facility-administered medications on file prior to visit.     Allergies  Allergen Reactions   Penicillins     Child- unsure of reaction    Tolmetin    Propofol  Rash   Sulfadiazine Rash   Tetracyclines & Related Rash    Review Of Systems:  Constitutional:   No  weight loss, night sweats,  Fevers, chills, fatigue, or  lassitude.  HEENT:   No headaches,  Difficulty swallowing,  Tooth/dental problems, or  Sore throat,                No sneezing, itching, ear ache, nasal congestion, post nasal drip,   CV:  No chest pain,  Orthopnea, PND, swelling in lower extremities, anasarca, dizziness, palpitations, syncope.   GI  No heartburn, indigestion, abdominal pain, nausea, vomiting, diarrhea, change in bowel habits, loss of appetite, bloody stools.   Resp: No shortness of breath with exertion or at rest.  No excess mucus, no productive cough,  No non-productive cough,  No coughing up of blood.  No change in color of mucus.  No wheezing.  No chest wall deformity  Skin: no rash or lesions.  GU: no dysuria, change in color of urine, no urgency or frequency.  No flank pain, no hematuria   MS:  No joint pain or swelling.  No decreased range of motion.  No back pain.  Psych:  No change in mood or affect. No depression or  anxiety.  No memory loss.   Vital Signs BP (!) 161/74   Pulse (!) 57   Temp 98.1 F (36.7 C) (Oral)   Ht 5' 9.5 (1.765 m)   Wt 196 lb 12.8 oz (89.3 kg)   SpO2 96%   BMI 28.65 kg/m    Physical Exam:  General- No distress,  A&Ox3, pleasant and appropriate ENT: No sinus tenderness, TM clear, pale nasal mucosa, no oral exudate,no post nasal drip, no LAN Cardiac: S1, S2, regular rate and rhythm, no murmur Chest: No wheeze/ rales/ dullness; no accessory muscle use, no nasal flaring, no sternal retractions Abd.: Soft Non-tender, nondistended, bowel sounds positive,Body mass index is 28.65 kg/m.  Ext: No clubbing cyanosis, edema, no  obvious deformities Neuro:  normal strength, moving all extremities x 4, alert and oriented x 3, appropriate and great historian Skin: No rashes, warm and dry, no obvious skin lesions Psych: normal mood and behavior  Physical Exam    Assessment/Plan Stable right lower lobe pulmonary nodule Previous biopsy 06/2023 showed atypical cells Never smoker  Plan Your CT chest shows a stable lung nodule. We will do a 12 month follow up CT Chest to continue to monitor his nodule. This will be due 05/2025 You will get a call closer to the time to get this scheduled.  Please call us  if you need us  sooner. If you develop any blood in your sputum, or have weight loss that you cannot explain, please call to be seen sooner. Please contact office for sooner follow up if symptoms do not improve or worsen or seek emergency care    I spent 20 minutes dedicated to the care of this patient on the date of this encounter to include pre-visit review of records, face-to-face time with the patient discussing conditions above, post visit ordering of testing, clinical documentation with the electronic health record, making appropriate referrals as documented, and communicating necessary information to the patient's healthcare team.  Assessment & Plan        Lauraine JULIANNA Lites,  NP 06/25/2024  10:33 AM

## 2024-06-25 NOTE — Patient Instructions (Signed)
 It is good to see you today. We have reviewed your scan , which shows a stable pulmonary nodule, no abnormal lymph nodes. This is reassuring.  We will do a 12 month follow up scan to maintain surveillance. This will be due 05/2025. You will get a call to get this scheduled closer to the time. We will do a video visit to review results 1-2 weeks after the scan. Call for any unexplained weight loss , or blood when in your sputum.  Call if you need us  sooner.  Please contact office for sooner follow up if symptoms do not improve or worsen or seek emergency care

## 2024-07-16 DIAGNOSIS — M1711 Unilateral primary osteoarthritis, right knee: Secondary | ICD-10-CM | POA: Diagnosis not present

## 2024-07-20 DIAGNOSIS — M170 Bilateral primary osteoarthritis of knee: Secondary | ICD-10-CM | POA: Diagnosis not present

## 2024-07-20 DIAGNOSIS — I10 Essential (primary) hypertension: Secondary | ICD-10-CM | POA: Diagnosis not present

## 2024-07-20 DIAGNOSIS — E7800 Pure hypercholesterolemia, unspecified: Secondary | ICD-10-CM | POA: Diagnosis not present
